# Patient Record
Sex: Female | Born: 1960
Health system: Southern US, Community
[De-identification: ages and names within clinical notes are randomized; demographics above are authoritative.]

## PROBLEM LIST (undated history)

## (undated) DIAGNOSIS — I10 Essential (primary) hypertension: Secondary | ICD-10-CM

## (undated) DIAGNOSIS — G44009 Cluster headache syndrome, unspecified, not intractable: Secondary | ICD-10-CM

## (undated) DIAGNOSIS — G43909 Migraine, unspecified, not intractable, without status migrainosus: Secondary | ICD-10-CM

## (undated) DIAGNOSIS — E785 Hyperlipidemia, unspecified: Secondary | ICD-10-CM

## (undated) DIAGNOSIS — R569 Unspecified convulsions: Secondary | ICD-10-CM

## (undated) HISTORY — DX: Essential (primary) hypertension: I10

## (undated) HISTORY — DX: Hyperlipidemia, unspecified: E78.5

## (undated) HISTORY — DX: Unspecified convulsions: R56.9

---

## 2008-09-08 ENCOUNTER — Emergency Department (HOSPITAL_COMMUNITY): Admission: EM | Admit: 2008-09-08 | Discharge: 2008-09-08 | Payer: Self-pay | Admitting: Emergency Medicine

## 2014-07-01 ENCOUNTER — Telehealth: Payer: Self-pay

## 2014-07-01 ENCOUNTER — Ambulatory Visit (INDEPENDENT_AMBULATORY_CARE_PROVIDER_SITE_OTHER): Payer: 59

## 2014-07-01 ENCOUNTER — Ambulatory Visit (INDEPENDENT_AMBULATORY_CARE_PROVIDER_SITE_OTHER): Payer: 59 | Admitting: Family Medicine

## 2014-07-01 VITALS — BP 146/78 | HR 84 | Temp 98.3°F | Resp 14 | Ht 68.0 in | Wt 212.0 lb

## 2014-07-01 DIAGNOSIS — R05 Cough: Secondary | ICD-10-CM

## 2014-07-01 DIAGNOSIS — J209 Acute bronchitis, unspecified: Secondary | ICD-10-CM

## 2014-07-01 DIAGNOSIS — R059 Cough, unspecified: Secondary | ICD-10-CM

## 2014-07-01 DIAGNOSIS — R062 Wheezing: Secondary | ICD-10-CM

## 2014-07-01 LAB — POCT CBC
GRANULOCYTE PERCENT: 70.5 % (ref 37–80)
HCT, POC: 46.6 % (ref 37.7–47.9)
Hemoglobin: 15.5 g/dL (ref 12.2–16.2)
LYMPH, POC: 1.5 (ref 0.6–3.4)
MCH: 29.2 pg (ref 27–31.2)
MCHC: 33.2 g/dL (ref 31.8–35.4)
MCV: 88.1 fL (ref 80–97)
MID (CBC): 0.5 (ref 0–0.9)
MPV: 6.8 fL (ref 0–99.8)
POC GRANULOCYTE: 4.9 (ref 2–6.9)
POC LYMPH PERCENT: 22.3 %L (ref 10–50)
POC MID %: 7.2 %M (ref 0–12)
Platelet Count, POC: 274 10*3/uL (ref 142–424)
RBC: 5.29 M/uL (ref 4.04–5.48)
RDW, POC: 13.7 %
WBC: 6.9 10*3/uL (ref 4.6–10.2)

## 2014-07-01 MED ORDER — AZITHROMYCIN 250 MG PO TABS
ORAL_TABLET | ORAL | Status: DC
Start: 2014-07-01 — End: 2014-07-03

## 2014-07-01 MED ORDER — ALBUTEROL SULFATE (2.5 MG/3ML) 0.083% IN NEBU
2.5000 mg | INHALATION_SOLUTION | Freq: Once | RESPIRATORY_TRACT | Status: AC
Start: 2014-07-01 — End: 2014-07-01
  Administered 2014-07-01: 2.5 mg via RESPIRATORY_TRACT

## 2014-07-01 MED ORDER — HYDROCODONE-HOMATROPINE 5-1.5 MG/5ML PO SYRP: 5.0000 mL | ORAL_SOLUTION | ORAL | Status: DC | PRN

## 2014-07-01 MED ORDER — ALBUTEROL SULFATE HFA 108 (90 BASE) MCG/ACT IN AERS: 2.0000 | INHALATION_SPRAY | Freq: Four times a day (QID) | RESPIRATORY_TRACT | Status: DC | PRN

## 2014-07-01 MED ORDER — PREDNISONE 20 MG PO TABS: ORAL_TABLET | ORAL | Status: DC

## 2014-07-01 NOTE — Patient Instructions (Signed)
Drink plenty of fluids and get enough rest  Take the prednisone 3 pills daily for 2 days, then 2 daily for 2 days, then 1 daily for 2 days, then one half daily for 4 days for opening up of the lungs from the inflammation  Take the azithromycin 2 pills initially, then 1 daily for 4 days for infection  Use the albuterol inhaler 2 inhalations every 4-6 hours as directed  Take the hydrocodone cough syrup (Hycodan) 1 teaspoon every 4-6 hours as needed for cough  Return at any time if acutely worse, and if not improved significantly over the next week or 10 days please return

## 2014-07-01 NOTE — Progress Notes (Addendum)
  Subjective:  Patient ID: Susan Hansen, female    DOB: October 31, 1960  Age: 54 y.o. MRN: [id number]  54 year old lady who is here for her first time. Over the past month or 6 weeks she's had a persistent cough. She had a cold-like infection initially. She had some low-grade fevers then. She has persisted intermittently with a cough. When she gets coughing hard sometimes has a hard time getting her breath. She wheezes some of those times. She is a nonsmoker. She has been bringing up some "cottage cheese" like stuff. She does not do any regular physical exercise. Sometimes she has coughing spells in the night that wake her up. She has tried various OTC medications including Mucinex and NyQuil. She is generally a healthy lady, has a lifetime history of a convulsive disorder for which she is on anticonvulsive therapy. She is fairly well controlled, but cannot drive. She works at [employer].   Objective:   Pleasant lady, alert and oriented. TMs normal. Throat clear. Nose not very congested today. Without significant nodes. Chest has coarse rhonchi in both lower lungs. On forced expiration she wheezes more. She gets into spasms of coughing with deep breathing.  Assessment & Plan:   Assessment: Bronchitis, with an asthmatic bronchitis component  Plan: Chest x-ray and CBC Try hand-held nebulizer treatment with albuterol to see if that opens her up better.  Peak flow 350 with predicted 435. Posttreatment the peak flow was still only 340 at best.  Will treat symptomatically and with antibiotics  [facility] reading (PRIMARY) by  Dr. Alwyn RenHopper No acute infiltrates  Results for orders placed or performed in visit on 07/01/14  POCT CBC  Result Value Ref Range   WBC 6.9 4.6 - 10.2 K/uL   Lymph, poc 1.5 0.6 - 3.4   POC LYMPH PERCENT 22.3 10 - 50 %L   MID (cbc) 0.5 0 - 0.9   POC MID % 7.2 0 - 12 %M   POC Granulocyte 4.9 2 - 6.9   Granulocyte percent 70.5 37 - 80 %G   RBC 5.29 4.04 - 5.48 M/uL   Hemoglobin 15.5  12.2 - 16.2 g/dL   HCT, POC 82.946.6 56.237.7 - 47.9 %   MCV 88.1 80 - 97 fL   MCH, POC 29.2 27 - 31.2 pg   MCHC 33.2 31.8 - 35.4 g/dL   RDW, POC 13.013.7 %   Platelet Count, POC 274 142 - 424 K/uL   MPV 6.8 0 - 99.8 fL   .    Patient Instructions  Drink plenty of fluids and get enough rest  Take the prednisone 3 pills daily for 2 days, then 2 daily for 2 days, then 1 daily for 2 days, then one half daily for 4 days for opening up of the lungs from the inflammation  Take the azithromycin 2 pills initially, then 1 daily for 4 days for infection  Use the albuterol inhaler 2 inhalations every 4-6 hours as directed  Take the hydrocodone cough syrup (Hycodan) 1 teaspoon every 4-6 hours as needed for cough  Return at any time if acutely worse, and if not improved significantly over the next week or 10 days please return     Lyon Dumont, MD 07/01/2014

## 2014-07-01 NOTE — Telephone Encounter (Signed)
Patient states she no longer uses [facility] and meant to have her prescriptions [facility] on [address]. [facility] but is requesting "Albuterol", "Azithromyin" and her "Prednisone" to be [facility] to [facility]. I advised patient to contact [facility] and see if they would transfer them for her. She stated she would try but still requesting message to be sent for her. Patients call back number for today is [phone]

## 2014-07-02 NOTE — Telephone Encounter (Signed)
Fine [facility] to [facility]

## 2014-07-02 NOTE — Telephone Encounter (Signed)
Ok to send [facility] to [facility]?

## 2014-07-03 MED ORDER — PREDNISONE 20 MG PO TABS: ORAL_TABLET | ORAL | Status: DC

## 2014-07-03 MED ORDER — ALBUTEROL SULFATE HFA 108 (90 BASE) MCG/ACT IN AERS: 2.0000 | INHALATION_SPRAY | Freq: Four times a day (QID) | RESPIRATORY_TRACT | Status: DC | PRN

## 2014-07-03 MED ORDER — AZITHROMYCIN 250 MG PO TABS: ORAL_TABLET | ORAL | Status: DC

## 2014-07-03 NOTE — Telephone Encounter (Signed)
Rx's sent. Called pt to let her know, Unable to leave VM.

## 2015-04-01 ENCOUNTER — Ambulatory Visit (INDEPENDENT_AMBULATORY_CARE_PROVIDER_SITE_OTHER): Payer: 59 | Admitting: Physician Assistant

## 2015-04-01 VITALS — BP 146/86 | HR 81 | Temp 98.2°F | Resp 18 | Ht 67.25 in | Wt 221.6 lb

## 2015-04-01 DIAGNOSIS — J209 Acute bronchitis, unspecified: Secondary | ICD-10-CM

## 2015-04-01 MED ORDER — HYDROCOD POLST-CPM POLST ER 10-8 MG/5ML PO SUER: 5.0000 mL | Freq: Two times a day (BID) | ORAL | Status: DC | PRN

## 2015-04-01 MED ORDER — AZITHROMYCIN 250 MG PO TABS: ORAL_TABLET | ORAL | Status: AC

## 2015-04-01 MED ORDER — BENZONATATE 100 MG PO CAPS: 100.0000 mg | ORAL_CAPSULE | Freq: Three times a day (TID) | ORAL | Status: DC | PRN

## 2015-04-01 NOTE — Patient Instructions (Signed)
Drink plenty of water (64 oz/day) and get plenty of rest. If you have been prescribed a cough syrup, do not drive or operate heavy machinery while using this medication. Take tessalon during the day. Take zpak antibiotic as prescribed. Stop other home meds, except cough drops. If your symptoms are not improving in 10 days, return to clinic.

## 2015-04-01 NOTE — Progress Notes (Signed)
[facility] [address], [place] [id number] [phone]  Date:  04/01/2015   Name:  Susan RicherKathy P Grob   DOB:  [birth year]   MRN:  [id number]  PCP:  No primary care provider on file.    Chief Complaint: Cough   History of Present Illness:  This is a 55 y.o. female who is presenting with a persistent cough x 2.5 weeks. Mostly dry cough, minimal mucus in past few days. Cough has stayed the same since onset. Now getting a little sore in her bilateral ribs. Did have some nasal congestion but that resolved. No fever, chills, sore throat. Cough seems worse at night, although does not disturb her sleep. Has had mild wheezing. No SOB. Taking mucinex, cough drops and water. Not helping much.  Not a smoker. No history of asthma or env allergies. Only occ problems with heartburn.  Review of Systems:  Review of Systems See HPI  There are no active problems to display for this patient.   Prior to Admission medications   Medication Sig Start Date End Date Taking? Authorizing Provider  felbamate (FELBATOL) 600 MG tablet Take 600 mg by mouth 4 (four) times daily.   Yes Historical Provider, MD    No Known Allergies  Past Surgical History  Procedure Laterality Date  . Cesarean section      Social History  Substance Use Topics  . Smoking status: Never Smoker   . Smokeless tobacco: None  . Alcohol Use: None    Family History  Problem Relation Age of Onset  . Diabetes Father   . Hypertension Father     Medication list has been reviewed and updated.  Physical Examination:  Physical Exam  Constitutional: She is oriented to person, place, and time. She appears well-developed and well-nourished. No distress.  HENT:  Head: Normocephalic and atraumatic.  Right Ear: Hearing, external ear and ear canal normal.  Left Ear: Hearing, tympanic membrane, external ear and ear canal normal.  Nose: Nose normal.  Mouth/Throat: Uvula is midline, oropharynx is clear and  moist and mucous membranes are normal.  Eyes: Conjunctivae and lids are normal. Right eye exhibits no discharge. Left eye exhibits no discharge. No scleral icterus.  Cardiovascular: Normal rate, regular rhythm, normal heart sounds and normal pulses.   No murmur heard. Pulmonary/Chest: Effort normal. No respiratory distress. She has no wheezes. She has rhonchi (few). She has no rales.  Musculoskeletal: Normal range of motion.  Lymphadenopathy:       Head (right side): No submental, no submandibular and no tonsillar adenopathy present.       Head (left side): No submental, no submandibular and no tonsillar adenopathy present.    She has no cervical adenopathy.  Neurological: She is alert and oriented to person, place, and time.  Skin: Skin is warm, dry and intact. No lesion and no rash noted.  Psychiatric: She has a normal mood and affect. Her speech is normal and behavior is normal. Thought content normal.   BP 146/86 mmHg  Pulse 81  Temp(Src) 98.2 F (36.8 C) (Oral)  Resp 18  Ht 5' 7.25" (1.708 m)  Wt 221 lb 9.6 oz (100.517 kg)  BMI 34.46 kg/m2  SpO2 94%  Assessment and Plan:  1. Acute bronchitis, unspecified organism Return in 7-10 days if symptoms do not improve or at any time if symptoms worsen.  - benzonatate (TESSALON) 100 MG capsule; Take 1-2 capsules (100-200 mg total) by mouth 3 (three) times daily as needed  for cough.  Dispense: 40 capsule; Refill: 0 - chlorpheniramine-HYDROcodone (TUSSIONEX PENNKINETIC ER) 10-8 MG/5ML SUER; Take 5 mLs by mouth every 12 (twelve) hours as needed for cough.  Dispense: 100 mL; Refill: 0 - azithromycin (ZITHROMAX) 250 MG tablet; Take 2 tabs PO x 1 dose, then 1 tab PO QD x 4 days  Dispense: 6 tablet; Refill: 0   [place] [employer]. Dyke Brackett, MHS [facility]  04/01/2015

## 2017-06-03 DIAGNOSIS — G44021 Chronic cluster headache, intractable: Secondary | ICD-10-CM | POA: Diagnosis not present

## 2017-06-03 DIAGNOSIS — G44019 Episodic cluster headache, not intractable: Secondary | ICD-10-CM | POA: Diagnosis not present

## 2017-12-14 ENCOUNTER — Emergency Department (HOSPITAL_BASED_OUTPATIENT_CLINIC_OR_DEPARTMENT_OTHER)
Admission: EM | Admit: 2017-12-14 | Discharge: 2017-12-14 | Disposition: A | Discharge status: Home / Self Care | Payer: BLUE CROSS/BLUE SHIELD | Attending: Emergency Medicine | Admitting: Emergency Medicine

## 2017-12-14 ENCOUNTER — Other Ambulatory Visit: Payer: Self-pay

## 2017-12-14 ENCOUNTER — Encounter (HOSPITAL_BASED_OUTPATIENT_CLINIC_OR_DEPARTMENT_OTHER): Payer: Self-pay

## 2017-12-14 DIAGNOSIS — R110 Nausea: Secondary | ICD-10-CM | POA: Diagnosis not present

## 2017-12-14 DIAGNOSIS — R638 Other symptoms and signs concerning food and fluid intake: Secondary | ICD-10-CM | POA: Insufficient documentation

## 2017-12-14 DIAGNOSIS — R1012 Left upper quadrant pain: Secondary | ICD-10-CM | POA: Insufficient documentation

## 2017-12-14 DIAGNOSIS — R1013 Epigastric pain: Secondary | ICD-10-CM | POA: Diagnosis not present

## 2017-12-14 MED ORDER — ALUM & MAG HYDROXIDE-SIMETH 200-200-20 MG/5ML PO SUSP
30.0000 mL | Freq: Once | ORAL | Status: AC
  Administered 2017-12-14: 30 mL via ORAL
  Filled 2017-12-14: qty 30

## 2017-12-14 MED ORDER — OMEPRAZOLE 20 MG PO CPDR: 20.0000 mg | DELAYED_RELEASE_CAPSULE | Freq: Every day | ORAL | 1 refills | Status: DC

## 2017-12-14 NOTE — ED Triage Notes (Signed)
Pt c/o LUQ pain x 6 months-worse x 2-3 days-nausea-denies v/d-NAD-steady gait

## 2017-12-14 NOTE — ED Notes (Signed)
ED Provider at bedside. 

## 2017-12-14 NOTE — ED Provider Notes (Signed)
[facility] EMERGENCY DEPARTMENT Provider Note   CSN: [id number] Arrival date & time: 12/14/17  1237     History   Chief Complaint Chief Complaint  Patient presents with  . Abdominal Pain    HPI Susan Hansen is a 57 y.o. female w PMHx seizure disorder, presenting to the ED with 6 months of intermittent LUQ/epigastric abdominal pressure. She states symptoms come and go, sometimes worse when laying flat. She does have hx of GERD and gastric ulcers. States the decreased appetite and nausea remind her of ulcers, however the pressure is different. She endorses increased stress at home with deaths in the family and her husbands health. She occasionally treats with an OTC antacid or pepto bismol. Denies CP, SOB, cough, vomiting, diarrhea, constipation.   The history is provided by the patient.    Past Medical History:  Diagnosis Date  . Seizures (HCC)     There are no active problems to display for this patient.   Past Surgical History:  Procedure Laterality Date  . CESAREAN SECTION       OB History   None      Home Medications    Prior to Admission medications   Medication Sig Start Date End Date Taking? Authorizing Provider  benzonatate (TESSALON) 100 MG capsule Take 1-2 capsules (100-200 mg total) by mouth 3 (three) times daily as needed for cough. 04/01/15   Dorna Leitz, PA-C  chlorpheniramine-HYDROcodone (TUSSIONEX PENNKINETIC ER) 10-8 MG/5ML SUER Take 5 mLs by mouth every 12 (twelve) hours as needed for cough. 04/01/15   Dorna Leitz, PA-C  felbamate (FELBATOL) 600 MG tablet Take 600 mg by mouth 4 (four) times daily.    [provider]  omeprazole (PRILOSEC) 20 MG capsule Take 1 capsule (20 mg total) by mouth daily. 12/14/17   Robinson, [place] N, PA-C    Family History Family History  Problem Relation Age of Onset  . Diabetes Father   . Hypertension Father     Social History Social History   Tobacco Use  . Smoking status: Never Smoker    . Smokeless tobacco: Never Used  Substance Use Topics  . Alcohol use: Yes    Comment: rare  . Drug use: Never     Allergies   Patient has no known allergies.   Review of Systems Review of Systems  Constitutional: Positive for appetite change. Negative for fever.  Respiratory: Negative for cough and shortness of breath.   Cardiovascular: Negative for chest pain.  Gastrointestinal: Positive for abdominal pain and nausea. Negative for constipation, diarrhea and vomiting.  All other systems reviewed and are negative.    Physical Exam Updated Vital Signs BP (!) 176/101 (BP Location: Right Arm)   Pulse 83   Temp 98.4 F (36.9 C) (Oral)   Resp 18   Ht 5\' 8"  (1.727 m)   Wt 89.5 kg   SpO2 98%   BMI 30.01 kg/m   Physical Exam  Constitutional: She appears well-developed and well-nourished. No distress.  HENT:  Head: Normocephalic and atraumatic.  Eyes: Conjunctivae are normal.  Cardiovascular: Normal rate, regular rhythm and normal heart sounds.  Pulmonary/Chest: Effort normal and breath sounds normal.  Abdominal: Soft. Bowel sounds are normal. She exhibits no distension and no mass. There is tenderness (mild) in the left upper quadrant. There is no rigidity, no rebound and no guarding.  Neurological: She is alert.  Skin: Skin is warm.  Psychiatric: She has a normal mood and affect. Her behavior is  normal.  Nursing note and vitals reviewed.    ED Treatments / Results  Labs (all labs ordered are listed, but only abnormal results are displayed) Labs Reviewed - No data to display  EKG None  Radiology No results found.  Procedures Procedures (including critical care time)  Medications Ordered in ED Medications  alum & mag hydroxide-simeth (MAALOX/MYLANTA) 200-200-20 MG/5ML suspension 30 mL (30 mLs Oral Given 12/14/17 1348)     Initial Impression / Assessment and Plan / ED Course  I have reviewed the triage vital signs and the nursing notes.  Pertinent labs  & imaging results that were available during my care of the patient were reviewed by me and considered in my medical decision making (see chart for details).    Patient presenting with left upper quadrant abdominal "pressure" x6 months.  She states symptoms come and go.  Has associated decreased appetite and intermittent nausea.  No fevers, diarrhea, constipation, vomiting.  Does have history of GERD and gastric ulcers.  Has had increased stress at home.  On exam, she is well-appearing and not in distress.  Abdomen is soft with mild tenderness to the left upper quadrant.  No guarding or rebound.  Had shared decision-making with patient: given chronicity of symptoms and reassuring exam, discussed lab work versus discharge with symptomatic management.  She agreeable to plan with discharge with symptomatic management.  Patient symptoms have been ongoing for 6 months and unchanging.  She has increased stress at home, this may be causing a stress-induced gastritis, with her history of GERD and ulcers.  Low suspicion for acute intra-abdominal pathology with benign abdomen, presentation with red flags.  Discussed symptomatic management, diet modifications.  Prescribed omeprazole.  Recommend primary care follow-up for follow-up and blood pressure check.  Provided neurology referral per patient's request stating her neurologist is retiring in December and she needs a new provider for her seizure disorder.  No complaints regarding seizure disorder today.  She is well-appearing, agreeable to plan, and safe for discharge at this time.  Discussed results, findings, treatment and follow up. Patient advised of return precautions. Patient verbalized understanding and agreed with plan.   Final Clinical Impressions(s) / ED Diagnoses   Final diagnoses:  Intermittent left upper quadrant abdominal pain    ED Discharge Orders         Ordered    omeprazole (PRILOSEC) 20 MG capsule  Daily     12/14/17 1414            Robinson, [place] N, PA-C 12/14/17 1634    Melene PlanFloyd, Dan, DO 12/14/17 1920

## 2017-12-14 NOTE — Discharge Instructions (Signed)
Begin taking the omeprazole once daily.  Avoid acidic, spicy, greasy foods.  Avoid NSAIDs such as ibuprofen, Advil, Motrin, Aleve, BC powder, Goody's powder, aspirin.  These medications can aggravate your stomach lining.  Limit your caffeine. You can take an over-the-counter Maalox as needed for abdominal discomfort as well. Establish primary care. Return to the emergency department if you develop fever, severe abdominal pain, uncontrollable vomiting, or new or concerning symptoms.

## 2017-12-14 NOTE — ED Notes (Addendum)
Pt states that she has had LUQ "pressure" for approx 6 months. She states the reason she came to ED today is because she is worried that it has not gone away. She states she has a loss of appetite for approx. 1 month.  She has not eaten today. Pt denies pain, nausea, vomiting and diarrhea. Pt denies feeling bloated. Pt has taken no medication for her symptoms. She states that she has been stressed r/t family issues.

## 2017-12-16 ENCOUNTER — Emergency Department (HOSPITAL_BASED_OUTPATIENT_CLINIC_OR_DEPARTMENT_OTHER): Payer: BLUE CROSS/BLUE SHIELD

## 2017-12-16 ENCOUNTER — Other Ambulatory Visit: Payer: Self-pay

## 2017-12-16 ENCOUNTER — Emergency Department (HOSPITAL_BASED_OUTPATIENT_CLINIC_OR_DEPARTMENT_OTHER)
Admission: EM | Admit: 2017-12-16 | Discharge: 2017-12-16 | Disposition: A | Discharge status: Home / Self Care | Payer: BLUE CROSS/BLUE SHIELD | Attending: Emergency Medicine | Admitting: Emergency Medicine

## 2017-12-16 ENCOUNTER — Encounter (HOSPITAL_BASED_OUTPATIENT_CLINIC_OR_DEPARTMENT_OTHER): Payer: Self-pay | Admitting: Emergency Medicine

## 2017-12-16 DIAGNOSIS — R079 Chest pain, unspecified: Secondary | ICD-10-CM | POA: Diagnosis not present

## 2017-12-16 DIAGNOSIS — R030 Elevated blood-pressure reading, without diagnosis of hypertension: Secondary | ICD-10-CM | POA: Diagnosis not present

## 2017-12-16 DIAGNOSIS — R0789 Other chest pain: Secondary | ICD-10-CM | POA: Insufficient documentation

## 2017-12-16 DIAGNOSIS — Z79899 Other long term (current) drug therapy: Secondary | ICD-10-CM | POA: Insufficient documentation

## 2017-12-16 DIAGNOSIS — F419 Anxiety disorder, unspecified: Secondary | ICD-10-CM | POA: Insufficient documentation

## 2017-12-16 LAB — CBC
HCT: 44.8 % (ref 36.0–46.0)
Hemoglobin: 14.3 g/dL (ref 12.0–15.0)
MCH: 29.2 pg (ref 26.0–34.0)
MCHC: 31.9 g/dL (ref 30.0–36.0)
MCV: 91.4 fL (ref 80.0–100.0)
NRBC: 0 % (ref 0.0–0.2)
PLATELETS: 165 10*3/uL (ref 150–400)
RBC: 4.9 MIL/uL (ref 3.87–5.11)
RDW: 13 % (ref 11.5–15.5)
WBC: 4.8 10*3/uL (ref 4.0–10.5)

## 2017-12-16 LAB — I-STAT CHEM 8, ED
BUN: 14 mg/dL (ref 6–20)
CHLORIDE: 102 mmol/L (ref 98–111)
Calcium, Ion: 1.17 mmol/L (ref 1.15–1.40)
Creatinine, Ser: 0.5 mg/dL (ref 0.44–1.00)
GLUCOSE: 111 mg/dL — AB (ref 70–99)
HCT: 43 % (ref 36.0–46.0)
Hemoglobin: 14.6 g/dL (ref 12.0–15.0)
Potassium: 4.1 mmol/L (ref 3.5–5.1)
SODIUM: 139 mmol/L (ref 135–145)
TCO2: 26 mmol/L (ref 22–32)

## 2017-12-16 LAB — TROPONIN I: Troponin I: <0.03 ng/mL (ref <0.03)

## 2017-12-16 LAB — D-DIMER, QUANTITATIVE (NOT AT ARMC): D DIMER QUANT: 0.29 ug{FEU}/mL (ref 0.00–0.50)

## 2017-12-16 MED ORDER — SUCRALFATE 1 GM/10ML PO SUSP: 1.0000 g | Freq: Four times a day (QID) | ORAL | 0 refills | Status: DC

## 2017-12-16 MED ORDER — ACETAMINOPHEN 500 MG PO TABS
1000.0000 mg | ORAL_TABLET | Freq: Once | ORAL | Status: AC
  Administered 2017-12-16: 1000 mg via ORAL
  Filled 2017-12-16: qty 2

## 2017-12-16 MED FILL — CARAFATE 1 GM/10 ML SUSP: 1 | 11 days supply | Qty: 420 | Fill #0

## 2017-12-16 NOTE — ED Notes (Signed)
ED Provider at bedside. 

## 2017-12-16 NOTE — Discharge Instructions (Addendum)
Your chest tightness does not appear to be related to cardiac disease.  I do feel this is more of a musculoskeletal source and could be related to a rotator cuff injury.  This will need further evaluation by a primary care physician.  Please work on establishing care.  Is also important that you avoid smoking marijuana.

## 2017-12-16 NOTE — ED Provider Notes (Signed)
[facility] EMERGENCY DEPARTMENT Provider Note   CSN: [id number] Arrival date & time: 12/16/17  0805     History   Chief Complaint Chief Complaint  Patient presents with  . Chest Pain    HPI Susan Hansen is a 57 y.o. female.  Patient is a 58 year old female presenting with left-sided chest pain.  PMH significant for seizure disorder.  Patient has no prior cardiac history or family history of early MI or heart disease.  Patient reports gradual onset of left-sided chest tightness over the last 6 months which has worsened over the last few days.  Pain is not worse with exertion not improved with rest.  Chest tightness radiates down left arm into left jaw.  Patient denies associated diaphoresis, shortness of breath, vomiting but does report some nausea unrelated to chest tightness episodes.  Patient does not take any medication.  She is followed by a neurologist for her seizure disorder but does not see a PCP.  Patient denies tobacco use but does report "remote use of marijuana" with her last hit yesterday which she says helps with her seizures.  Patient denies alcohol or other illicit drug use.     Past Medical History:  Diagnosis Date  . Seizures (HCC)     There are no active problems to display for this patient.   Past Surgical History:  Procedure Laterality Date  . CESAREAN SECTION       OB History   None      Home Medications    Prior to Admission medications   Medication Sig Start Date End Date Taking? Authorizing Provider  benzonatate (TESSALON) 100 MG capsule Take 1-2 capsules (100-200 mg total) by mouth 3 (three) times daily as needed for cough. 04/01/15   Dorna Leitz, PA-C  chlorpheniramine-HYDROcodone (TUSSIONEX PENNKINETIC ER) 10-8 MG/5ML SUER Take 5 mLs by mouth every 12 (twelve) hours as needed for cough. 04/01/15   Dorna Leitz, PA-C  felbamate (FELBATOL) 600 MG tablet Take 600 mg by mouth 4 (four) times daily.    [provider]    omeprazole (PRILOSEC) 20 MG capsule Take 1 capsule (20 mg total) by mouth daily. 12/14/17   Robinson, [place] N, PA-C  sucralfate (CARAFATE) 1 GM/10ML suspension Take 10 mLs (1 g total) by mouth 4 (four) times daily. 12/16/17 12/16/18  Wendee Beavers, DO    Family History Family History  Problem Relation Age of Onset  . Diabetes Father   . Hypertension Father     Social History Social History   Tobacco Use  . Smoking status: Never Smoker  . Smokeless tobacco: Never Used  Substance Use Topics  . Alcohol use: Yes    Comment: rare  . Drug use: Never     Allergies   Patient has no known allergies.   Review of Systems Review of Systems  Constitutional: Negative for chills, diaphoresis, fatigue and fever.  HENT: Negative for congestion and sore throat.   Eyes: Negative for pain and visual disturbance.  Respiratory: Negative for cough and shortness of breath.   Cardiovascular: Positive for chest pain and palpitations.  Gastrointestinal: Positive for nausea. Negative for abdominal pain and vomiting.  Genitourinary: Negative for dysuria and frequency.  Musculoskeletal: Negative for arthralgias and back pain.  Skin: Negative for color change and rash.  Neurological: Negative for dizziness, seizures, syncope and headaches.  Psychiatric/Behavioral: The patient is nervous/anxious.   All other systems reviewed and are negative.    Physical Exam Updated Vital Signs  BP 135/73   Pulse 75   Temp 98.3 F (36.8 C) (Oral)   Resp 15   SpO2 95%   Physical Exam  Constitutional: She appears well-developed and well-nourished.  Non-toxic appearance. She does not appear ill.  HENT:  Head: Normocephalic and atraumatic.  Eyes: Conjunctivae are normal.  Neck: Neck supple.  Cardiovascular: Normal rate and regular rhythm. Exam reveals no S3, no S4 and no friction rub.  No murmur heard. Pulmonary/Chest: Effort normal and breath sounds normal. No respiratory distress.  Abdominal:  Soft. There is no tenderness.  Musculoskeletal: She exhibits no edema.  Moderate left shoulder discomfort with gentle passive ROM.  Bilateral symmetrical minimal nonpitting edema lower extremities up to mid shin  Neurological: She is alert.  Skin: Skin is warm and dry. She is not diaphoretic. No pallor.  Psychiatric: Her mood appears anxious.  Tearful on exam  Nursing note and vitals reviewed.    ED Treatments / Results  Labs (all labs ordered are listed, but only abnormal results are displayed) Labs Reviewed  I-STAT CHEM 8, ED - Abnormal; Notable for the following components:      Result Value   Glucose, Bld 111 (*)    All other components within normal limits  CBC  TROPONIN I  D-DIMER, QUANTITATIVE (NOT AT [facility])    EKG EKG Interpretation  Date/Time:  Friday December 16 2017 08:12:47 EST Ventricular Rate:  91 PR Interval:    QRS Duration: 89 QT Interval:  324 QTC Calculation: 399 R Axis:   26 Text Interpretation:  Sinus rhythm Probable left atrial enlargement Baseline wander in lead(s) II III aVF No STEMI.  Confirmed by Alona BeneLong, Joshua [phone]([id number]) on 12/16/2017 8:23:46 AM   Radiology Dg Chest Port 1 View  Result Date: 12/16/2017 CLINICAL DATA:  Chest pain EXAM: PORTABLE CHEST 1 VIEW COMPARISON:  July 01, 2014 FINDINGS: No edema or consolidation. Heart size and pulmonary vascularity are normal. No adenopathy. No bone lesions. No pneumothorax. IMPRESSION: No edema or consolidation. Electronically Signed   By: Bretta BangWilliam  Woodruff III M.D.   On: 12/16/2017 08:41    Procedures Procedures (including critical care time)  Medications Ordered in ED Medications  acetaminophen (TYLENOL) tablet 1,000 mg (1,000 mg Oral Given 12/16/17 0932)     Initial Impression / Assessment and Plan / ED Course  I have reviewed the triage vital signs and the nursing notes.  Pertinent labs & imaging results that were available during my care of the patient were reviewed by me and considered in my  medical decision making (see chart for details).  Patient is a 57 year old female presenting with atypical chest pain with no prior history of cardiac history.  She is in clear discomfort regarding her left chest tightness and is anxious and tearful on exam.  Lungs are clear to auscultation and there is no evidence of DVT.  Left chest discomfort is exacerbated with gentle range of motion of the left shoulder.  Vital signs here for hypertension at 160s/80s, otherwise stable.  EKG NSR at 91 bpm, no ST changes or T wave abnormalities, QTC 399, no blocks. CXR unremarkable.  Troponin and d-dimer negative.  CBC and i-STAT Chem-8 unremarkable.  Low suspicion for ACS or PE.  Suspect chronic chest discomfort is related to musculoskeletal source.  Could be rotator cuff related given vague localized pain and exacerbation with movement in all directions of left arm.  Reviewed return precautions.  Discussed conservative management.  Instructed patient to establish with PCP for further work-up of  chronic left arm pain.  Patient requesting additional therapy for reflux while on PPI.  Given prescription for Carafate.  Also given contact information for heart and vascular.  Final Clinical Impressions(s) / ED Diagnoses   Final diagnoses:  Atypical chest pain    ED Discharge Orders         Ordered    sucralfate (CARAFATE) 1 GM/10ML suspension  4 times daily     12/16/17 1002           Wendee Beavers, DO 12/16/17 1002    Maia Plan, MD 12/17/17 [phone]

## 2017-12-16 NOTE — ED Triage Notes (Signed)
Pt states heaviness in left chest for 6 months, but has been more acute for the last few days. States radiation into jaw and SOB accompanies this pain.

## 2017-12-20 DIAGNOSIS — R1012 Left upper quadrant pain: Secondary | ICD-10-CM | POA: Diagnosis not present

## 2017-12-27 ENCOUNTER — Other Ambulatory Visit: Payer: Self-pay | Admitting: Gastroenterology

## 2017-12-27 DIAGNOSIS — R1012 Left upper quadrant pain: Secondary | ICD-10-CM | POA: Diagnosis not present

## 2017-12-30 ENCOUNTER — Ambulatory Visit
Admission: RE | Admit: 2017-12-30 | Discharge: 2017-12-30 | Disposition: A | Discharge status: Home / Self Care | Payer: BLUE CROSS/BLUE SHIELD | Source: Ambulatory Visit | Attending: Gastroenterology | Admitting: Gastroenterology

## 2017-12-30 DIAGNOSIS — R1012 Left upper quadrant pain: Secondary | ICD-10-CM | POA: Diagnosis not present

## 2018-01-12 DIAGNOSIS — K635 Polyp of colon: Secondary | ICD-10-CM | POA: Diagnosis not present

## 2018-01-12 DIAGNOSIS — K2930 Chronic superficial gastritis without bleeding: Secondary | ICD-10-CM | POA: Diagnosis not present

## 2018-01-12 DIAGNOSIS — K5730 Diverticulosis of large intestine without perforation or abscess without bleeding: Secondary | ICD-10-CM | POA: Diagnosis not present

## 2018-01-12 DIAGNOSIS — K228 Other specified diseases of esophagus: Secondary | ICD-10-CM | POA: Diagnosis not present

## 2018-01-12 DIAGNOSIS — D124 Benign neoplasm of descending colon: Secondary | ICD-10-CM | POA: Diagnosis not present

## 2018-01-12 DIAGNOSIS — K449 Diaphragmatic hernia without obstruction or gangrene: Secondary | ICD-10-CM | POA: Diagnosis not present

## 2018-01-12 DIAGNOSIS — K3189 Other diseases of stomach and duodenum: Secondary | ICD-10-CM | POA: Diagnosis not present

## 2018-01-12 DIAGNOSIS — K219 Gastro-esophageal reflux disease without esophagitis: Secondary | ICD-10-CM | POA: Diagnosis not present

## 2018-01-12 DIAGNOSIS — R1012 Left upper quadrant pain: Secondary | ICD-10-CM | POA: Diagnosis not present

## 2018-01-17 DIAGNOSIS — D124 Benign neoplasm of descending colon: Secondary | ICD-10-CM | POA: Diagnosis not present

## 2018-01-17 DIAGNOSIS — K635 Polyp of colon: Secondary | ICD-10-CM | POA: Diagnosis not present

## 2018-01-17 DIAGNOSIS — K219 Gastro-esophageal reflux disease without esophagitis: Secondary | ICD-10-CM | POA: Diagnosis not present

## 2018-01-17 DIAGNOSIS — K2930 Chronic superficial gastritis without bleeding: Secondary | ICD-10-CM | POA: Diagnosis not present

## 2018-01-25 HISTORY — PX: BREAST BIOPSY: SHX20

## 2018-03-17 DIAGNOSIS — G40909 Epilepsy, unspecified, not intractable, without status epilepticus: Secondary | ICD-10-CM | POA: Diagnosis not present

## 2018-03-17 DIAGNOSIS — R030 Elevated blood-pressure reading, without diagnosis of hypertension: Secondary | ICD-10-CM | POA: Diagnosis not present

## 2018-05-11 ENCOUNTER — Ambulatory Visit: Payer: BLUE CROSS/BLUE SHIELD | Admitting: Neurology

## 2018-06-22 ENCOUNTER — Other Ambulatory Visit: Payer: Self-pay | Admitting: Family Medicine

## 2018-06-22 ENCOUNTER — Other Ambulatory Visit (HOSPITAL_COMMUNITY)
Admission: RE | Admit: 2018-06-22 | Discharge: 2018-06-22 | Disposition: A | Discharge status: Home / Self Care | Payer: BLUE CROSS/BLUE SHIELD | Source: Ambulatory Visit | Attending: Family Medicine | Admitting: Family Medicine

## 2018-06-22 DIAGNOSIS — R030 Elevated blood-pressure reading, without diagnosis of hypertension: Secondary | ICD-10-CM | POA: Diagnosis not present

## 2018-06-22 DIAGNOSIS — Z124 Encounter for screening for malignant neoplasm of cervix: Secondary | ICD-10-CM | POA: Diagnosis not present

## 2018-06-22 DIAGNOSIS — Z1211 Encounter for screening for malignant neoplasm of colon: Secondary | ICD-10-CM | POA: Diagnosis not present

## 2018-06-22 DIAGNOSIS — Z23 Encounter for immunization: Secondary | ICD-10-CM | POA: Diagnosis not present

## 2018-06-22 DIAGNOSIS — Z1159 Encounter for screening for other viral diseases: Secondary | ICD-10-CM | POA: Diagnosis not present

## 2018-06-22 DIAGNOSIS — Z13220 Encounter for screening for lipoid disorders: Secondary | ICD-10-CM | POA: Diagnosis not present

## 2018-06-22 DIAGNOSIS — Z0000 Encounter for general adult medical examination without abnormal findings: Secondary | ICD-10-CM | POA: Diagnosis not present

## 2018-06-22 DIAGNOSIS — G40909 Epilepsy, unspecified, not intractable, without status epilepticus: Secondary | ICD-10-CM | POA: Diagnosis not present

## 2018-06-27 LAB — CYTOLOGY - PAP
Diagnosis: NEGATIVE
HPV: NOT DETECTED

## 2018-07-10 ENCOUNTER — Telehealth: Payer: Self-pay | Admitting: *Deleted

## 2018-07-10 NOTE — Telephone Encounter (Signed)
I called the pt re: 6/18 8:00 AM appt. I offered an in-office visit if pt comfortable. I advised a call back would be needed by 6/17 Wed or appt would be canceled and pt will need to cb to r/s for next available. I reviewed the [place] screening questions as well and advised mask is required for visit and to have pt bring her own. Left office number for cb.

## 2018-07-11 NOTE — Telephone Encounter (Addendum)
Pt returned call and has accepted an In Office visit. Pt states that she has not left the state in the past 14 days , has not been exposed to anyone with the virus and is not having any symptoms. Pt is aware of having to bring her own mask.

## 2018-07-13 ENCOUNTER — Encounter: Payer: Self-pay | Admitting: Neurology

## 2018-07-13 ENCOUNTER — Other Ambulatory Visit: Payer: Self-pay

## 2018-07-13 ENCOUNTER — Ambulatory Visit: Payer: BLUE CROSS/BLUE SHIELD | Admitting: Neurology

## 2018-07-13 ENCOUNTER — Ambulatory Visit (INDEPENDENT_AMBULATORY_CARE_PROVIDER_SITE_OTHER): Payer: BLUE CROSS/BLUE SHIELD | Admitting: Neurology

## 2018-07-13 VITALS — BP 132/75 | HR 76 | Temp 98.0°F | Ht 66.0 in | Wt 185.0 lb

## 2018-07-13 DIAGNOSIS — G44019 Episodic cluster headache, not intractable: Secondary | ICD-10-CM

## 2018-07-13 DIAGNOSIS — G40219 Localization-related (focal) (partial) symptomatic epilepsy and epileptic syndromes with complex partial seizures, intractable, without status epilepticus: Secondary | ICD-10-CM

## 2018-07-13 MED ORDER — LORAZEPAM 1 MG PO TABS: ORAL_TABLET | ORAL | 4 refills | Status: DC

## 2018-07-13 NOTE — Progress Notes (Signed)
[facility]    Provider:  Dr Jaynee Hansen Requesting Provider: Marda Stalker, PA-C Primary Care Provider:  Marda Stalker, PA-C  CC:  Susan Hansen  HPI:  Susan Hansen is a 58 y.o. female here as requested by Susan Stalker, PA-C for partial seizures with generalization and cluster headaches on felbamate and topamax. PMHx HTm as well. Per neurology notes, she was on felbamate 600mg  3.5 tabs 4x daily and topamax 25mg  at last appt with neuro 07/07/17. Dxed at age 87. She does not drive. Last generalized seizure over a year ago. Typically one every 12-15 months. She was seeing Susan Hansen for many years and now he is retiring. Her father passed away in 07-08-18 and her best friend 09/2010. First seizure at 75, she has cluster headaches. She is not on the topamax bc the clusters are in remission. Last generalized seizure a year ago. She feels shaky sometimes and thinks it is glucose and eating protein(eggs) in the morning helps.. She has a seizure aura, she starts feeling something in her stomach, she can go to a room at that time, she starts shaking and she "zones out" and pictures [place] and drains her she feels extremely tired, everything goes into slow motion and then sometimes go away and then generalizes.  She is under stress. She started on neurontin, depakote, dilantin, tegretol,  valium and failed multiple meds until the Felbamate which works for her. Last aura once in the last 8-9 months and did not progress. Last GTC 1.5 years ago. Unclear why she had a breakthrough, she doesn't know how long they last or what happens, when it resolves she feels emotional and guilty. She does not drive. Unclear etiology of seizures but when she was 13 months she fell off a table. She has had extensive testing in the past and eegs and imaging.   Reviewed notes, labs and imaging from outside physicians, which showed:  CT head 09/08/2008 showed No acute intracranial abnormalities  including mass lesion or mass effect, hydrocephalus, extra-axial fluid collection, midline shift, hemorrhage, or acute infarction, large ischemic events (personally reviewed images)  I reviewed neurology notes from Susan Hansen.  The first note I see is in April 2017.  She has epilepsy in addition to headaches.  Patient at that time had complained of headache and went to ER and had a CT scan.  In addition to longstanding partial epilepsy she has difficulty with recurring headaches, they occur at night, unilateral occurring in the right orbital area starts as a small pain rapidly builds up to an intense almost intolerable steady headache.  No nausea or vomiting or photophobia.  The episodes last 30 minutes.  She does have some rhinorrhea on the same side recently seen in the emergency room where CAT scan was normal at that time.  Butalbital did not help.  She is had several episodes.  Diagnosed with cluster headaches.  No significant change in her seizure pattern with an occasional partial simple seizure.   Last time she was seen was in 07-07-17.  She was seen for seizure follow-up.  At that time patient was well controlled but seem to think she had rare episodes that might be an aura.  She is taking both Felbatol and Topamax on a regular basis with no particular problem.  Diagnosed with partial seizure with generalization.  Seizures well controlled.  She has been on this medication for years without progression to a generalized seizure.  Since she had been on the medication  for years no blood levels.  She is seen mostly every year.  Review of Systems: Patient complains of symptoms per HPI as well as the following symptoms: seizures. Pertinent negatives and positives per HPI. All others negative.   Social History   Socioeconomic History  . Marital status: Married    Spouse name: Not on file  . Number of children: 2  . Years of education: Not on file  . Highest education level: High school graduate   Occupational History  . Not on file  Social Needs  . Financial resource strain: Not on file  . Food insecurity    Worry: Not on file    Inability: Not on file  . Transportation needs    Medical: Not on file    Non-medical: Not on file  Tobacco Use  . Smoking status: Never Smoker  . Smokeless tobacco: Never Used  Substance and Sexual Activity  . Alcohol use: Not Currently    Comment: rare  . Drug use: Never  . Sexual activity: Not on file  Lifestyle  . Physical activity    Days per week: Not on file    Minutes per session: Not on file  . Stress: Not on file  Relationships  . Social Musicianconnections    Talks on phone: Not on file    Gets together: Not on file    Attends religious service: Not on file    Active member of club or organization: Not on file    Attends meetings of clubs or organizations: Not on file    Relationship status: Not on file  . Intimate partner violence    Fear of current or ex partner: Not on file    Emotionally abused: Not on file    Physically abused: Not on file    Forced sexual activity: Not on file  Other Topics Concern  . Not on file  Social History Narrative   Lives at home with her husband   Right handed    Family History  Problem Relation Age of Onset  . Diabetes Father   . Hypertension Father   . Seizures Neg Hx     Past Medical History:  Diagnosis Date  . Seizures [facility](HCC)     Patient Active Problem List   Diagnosis Date Noted  . Partial epilepsy with impairment of consciousness, with intractable epilepsy (HCC) 07/14/2018  . Episodic cluster headache 07/14/2018    Past Surgical History:  Procedure Laterality Date  . CESAREAN SECTION      Current Outpatient Medications  Medication Sig Dispense Refill  . Ascorbic Acid (VITAMIN C PO) Take by mouth.    Marland Kitchen. b complex vitamins tablet Take 1 tablet by mouth daily.    . Chlorpheniramine Maleate (ALLERGY RELIEF PO) Take 1 tablet by mouth daily. Allertec from [employer]    . Coenzyme Q10  (COQ10 PO) Take by mouth.    . felbamate (FELBATOL) 600 MG tablet Takes 3 tablets in the morning (5 AM), 1 tablet at lunchtime, and 3 tablets before bed.    Marland Kitchen. LECITHIN PO Take by mouth.    Marland Kitchen. lisinopril-hydrochlorothiazide (ZESTORETIC) 20-25 MG tablet Take 1 tablet by mouth daily.    Marland Kitchen. MAGNESIUM PO Take by mouth. At least 400 mg daily    . S-Adenosylmethionine (SAM-E PO) Take by mouth.    Marland Kitchen. LORazepam (ATIVAN) 1 MG tablet Please take 1 tablet at onset of aura. May take an additional dose in 5-15 minutes. Max 2mg  a day. 30 tablet 4  No current facility-administered medications for this visit.     Allergies as of 07/13/2018  . (No Known Allergies)    Vitals: BP 132/75 (BP Location: Right Arm, Patient Position: Sitting)   Pulse 76   Temp 98 F (36.7 C) Comment: taken by front staff  Ht 5\' 6"  (1.676 m)   Wt 185 lb (83.9 kg)   BMI 29.86 kg/m  Last Weight:  Wt Readings from Last 1 Encounters:  07/13/18 185 lb (83.9 kg)   Last Height:   Ht Readings from Last 1 Encounters:  07/13/18 5\' 6"  (1.676 m)     Physical exam: Exam: Gen: NAD, conversant, well nourised, obese, well groomed                     CV: RRR, no MRG. No Carotid Bruits. No peripheral edema, warm, nontender Eyes: Conjunctivae clear without exudates or hemorrhage  Neuro: Detailed Neurologic Exam  Speech:    Speech is normal; fluent and spontaneous with normal comprehension.  Cognition:    The patient is oriented to person, place, and time;     recent and remote memory intact;     language fluent;     normal attention, concentration,     fund of knowledge Cranial Nerves:    The pupils are equal, round, and reactive to light. The fundi are normal and spontaneous venous pulsations are present. Visual fields are full to finger confrontation. Extraocular movements are intact. Trigeminal sensation is intact and the muscles of mastication are normal. The face is symmetric. The palate elevates in the midline. Hearing  intact. Voice is normal. Shoulder shrug is normal. The tongue has normal motion without fasciculations.   Coordination:    Normal finger to nose and heel to shin. Normal rapid alternating movements.   Gait:    Heel-toe and tandem gait are normal.   Motor Observation:    No asymmetry, no atrophy, and no involuntary movements noted. Tone:    Normal muscle tone.    Posture:    Posture is normal. normal erect    Strength:    Strength is V/V in the upper and lower limbs.      Sensation: intact to LT     Reflex Exam:  DTR's:    Deep tendon reflexes in the upper and lower extremities are normal bilaterally.   Toes:    The toes are downgoing bilaterally.   Clonus:    Clonus is absent.    Assessment/Plan:  58 y.o. female here as requested by Jarrett SohoWharton, Courtney, PA-C for partial seizures with generalization and cluster headaches on felbamate and topamax. PMHx HTm as well. Per neurology notes, she was on felbamate 600mg  3.5 tabs 4x daily and topamax 25mg  at last appt with neuro 05-2017. Dxed at age 58. She does not drive.  Episodic Cluster HA: Used Topiramate and did well. Not on it now as the cluster headaches are in remission. If they occur, start high dose steroids 9start at 100mg  daily and taper over 2 weeks), Topamax or Emgality. She will call.   Partial seizures with generaliztion: Continue Felbamate. Ativan for aura. Per prior neuro notes, she has been prescribed Felbamate 600mg  3.5 tabs 4x a day(#240). This is an excessive does but considering she has been stable on this dose for years will continue. Discussed risks, she understands this is a very high dose and acknowledges risks. I called [facility] pharmacy on [address] to confirm and the Felbamate was not filled there. I will email  patient and ask her where she fills the Felbamate and call and verify dose and pills dispenced before prescribing.   Meds ordered this encounter  Medications  . LORazepam (ATIVAN) 1 MG tablet    Sig:  Please take 1 tablet at onset of aura. May take an additional dose in 5-15 minutes. Max 2mg  a day.    Dispense:  30 tablet    Refill:  4   Per [facility] [place] statutes, patients with seizures are not allowed to drive until they have been seizure-free for six months.    Use caution when using heavy equipment or power tools. Avoid working on ladders or at heights. Take showers instead of baths. Ensure the water temperature is not too high on the home water heater. Do not go swimming alone. Do not lock yourself in a room alone (i.e. bathroom). When caring for infants or small children, sit down when holding, feeding, or changing them to minimize risk of injury to the child in the event you have a seizure. Maintain good sleep hygiene. Avoid alcohol.    If patient has another seizure, call 911 and bring them back to the ED if: A.  The seizure lasts longer than 5 minutes.      B.  The patient doesn't wake shortly after the seizure or has new problems such as difficulty seeing, speaking or moving following the seizure C.  The patient was injured during the seizure D.  The patient has a temperature over 102 F (39C) E.  The patient vomited during the seizure and now is having trouble breathing   Cc: Jarrett SohoWharton, Courtney, PA-C,    Susan Hansen , MD  [facility] [address] [place], [place] [id number]  Phone [phone] Fax [phone]

## 2018-07-13 NOTE — Patient Instructions (Addendum)
Continue current medications Ativan(Lorazepam) for aura    Lorazepam: Patient drug information  Access Lex[employer] Online here.  Copyright [id number] [employer] All rights reserved.  (For additional information see "Lorazepam: Drug information" and see "Lorazepam: Pediatric drug information") Brand Names: US  Ativan;  LORazepam Intensol Brand Names: [place]  APO-LORazepam;  Ativan;  DOM-LORazepam [DSC];  PMS-LORazepam;  PRO-LORazepam;  TEVA-LORazepam Warning  . This drug is a benzodiazepine. The use of a benzodiazepine drug along with opioid drugs has led to very bad side effects. Side effects that have happened include slowed or trouble breathing and death. Opioid drugs include drugs like codeine, oxycodone, and morphine. Opioid drugs are used to treat pain and some are used to treat cough. Talk with the doctor.  . If you are taking this drug with an opioid drug, get medical help right away if you feel very sleepy or dizzy; if you have slow, shallow, or trouble breathing; or if you pass out. Caregivers or others need to get medical help right away if the patient does not respond, does not answer or react like normal, or will not wake up. What is this drug used for?  . It is used to treat anxiety.  . It is used to treat seizures.  . It is used to ease anxiety before surgery.  . It may be given to you for other reasons. Talk with the doctor. What do I need to tell my doctor BEFORE I take this drug?  . If you are allergic to this drug; any part of this drug; or any other drugs, foods, or substances. Tell your doctor about the allergy and what signs you had.  . If you have any of these health problems: Glaucoma, low mood (depression), or certain mental problems.  . If you have sleep apnea.  This is not a list of all drugs or health problems that interact with this drug.  Tell your doctor and pharmacist about all of your drugs (prescription or OTC, natural products, vitamins) and health  problems. You must check to make sure that it is safe for you to take this drug with all of your drugs and health problems. Do not start, stop, or change the dose of any drug without checking with your doctor. What are some things I need to know or do while I take this drug?  All products:  Marland Kitchen. Tell all of your health care providers that you take this drug. This includes your doctors, nurses, pharmacists, and dentists.  . Talk with your doctor before you use other drugs and natural products that slow your actions.  . Have your blood work checked if you are on this drug for a long time. Talk with your doctor.  . This drug may be habit-forming with long-term use.  . Do not take this drug for longer than you were told by your doctor.  . If you have been taking this drug on a regular basis and you stop it all of a sudden, you may have signs of withdrawal. Do not stop taking this drug all of a sudden without calling your doctor. Tell your doctor if you have any bad effects.  . If you are 60 or older, use this drug with care. You could have more side effects.  . Use with care in children. Talk with the doctor.  . This drug may cause harm to the unborn baby if you take it while you are pregnant. If you are pregnant or you get pregnant  while taking this drug, call your doctor right away.  . Tell your doctor if you are breast-feeding or plan to breast-feed. This drug passes into breast milk and may harm your baby.  All oral products:  . Avoid driving and doing other tasks or actions that call for you to be alert until you see how this drug affects you.  Marland Kitchen. Avoid drinking alcohol while taking this drug.  Injection:  . Avoid driving and doing other tasks or actions that call for alertness for 1 to 2 full days after getting this drug and until the effects of this drug have worn off.  . Do not try to get out of bed without help for at least 8 hours after you use this drug. You may fall and hurt yourself.  . Avoid  drinking alcohol for 1 to 2 full days after getting this drug.  . Some products have benzyl alcohol. Do not give a product that has benzyl alcohol in it to a newborn or infant. Talk with the doctor to see if this product has benzyl alcohol in it.  For a procedure:  . Studies in young animals and children have shown that frequent or long-term use of anesthesia drugs or drugs used for sleep in children younger than 683 years of age may lead to long-term brain problems. This may also happen in unborn babies if the mother uses this drug during the third trimester of pregnancy. Talk with the doctor. What are some side effects that I need to call my doctor about right away?  WARNING/CAUTION: Even though it may be rare, some people may have very bad and sometimes deadly side effects when taking a drug. Tell your doctor or get medical help right away if you have any of the following signs or symptoms that may be related to a very bad side effect:  . Signs of an allergic reaction, like rash; hives; itching; red, swollen, blistered, or peeling skin with or without fever; wheezing; tightness in the chest or throat; trouble breathing, swallowing, or talking; unusual hoarseness; or swelling of the mouth, face, lips, tongue, or throat.  . New or worse behavior or mood changes like depression or thoughts of suicide.  . Hallucinations (seeing or hearing things that are not there).  . Change in balance.  . Feeling confused.  . Memory problems or loss.  . Very bad dizziness or passing out.  . Change in eyesight.  . Muscle weakness.  Dot Been. Dark urine or yellow skin or eyes.  . This drug may cause very bad and sometimes deadly breathing problems. Call your doctor right away if you have slow, shallow, or trouble breathing. What are some other side effects of this drug?  All drugs may cause side effects. However, many people have no side effects or only have minor side effects. Call your doctor or get medical help if any of  these side effects or any other side effects bother you or do not go away:  All products:  Marland Kitchen. Feeling dizzy, sleepy, tired, or weak.  Marland Kitchen. Headache.  Injection:  . Irritation where the shot is given.  These are not all of the side effects that may occur. If you have questions about side effects, call your doctor. Call your doctor for medical advice about side effects.  You may report side effects to your national health agency. How is this drug best taken?  Use this drug as ordered by your doctor. Read all information given to you. Follow  all instructions closely.  All oral products:  Marland Kitchen. Take with or without food. Take with food if it causes an upset stomach.  Liquid (solution):  Marland Kitchen. Use the dropper that comes with this drug to measure the drug.  . Mix the liquid with water, juice, soda, applesauce, or pudding before taking it.  Tenna Child. Swallow the mixture right away. Do not store for use at a later time.  Under the tongue (sublingual) tablet:  . Place tablet under the tongue and let dissolve.  . Do not swallow for at least 2 minutes after using this drug.  Injection:  . It is given as a shot into a muscle or vein. What do I do if I miss a dose?  All oral products:  . If you take this drug on a regular basis, take a missed dose as soon as you think about it.  . If it is close to the time for your next dose, skip the missed dose and go back to your normal time.  . Do not take 2 doses at the same time or extra doses.  . Many times this drug is taken on an as needed basis. Do not take more often than told by the doctor.  Injection:  . Call your doctor to find out what to do. How do I store and/or throw out this drug?  Tablets and under the tongue (sublingual) tablets:  . Store at room temperature in a dry place. Do not store in a bathroom.  Liquid (solution):  . Store in a refrigerator. Do not freeze.  Marland Kitchen. Throw away any part not used after 3 months.  All oral products:  . Protect from light.   Injection:  . If you need to store this drug at home, talk with your doctor, nurse, or pharmacist about how to store it.  All products:  . Store this drug in a safe place where children cannot see or reach it, and where other people cannot get to it. A locked box or area may help keep this drug safe. Keep all drugs away from pets.  . Throw away unused or expired drugs. Do not flush down a toilet or pour down a drain unless you are told to do so. Check with your pharmacist if you have questions about the best way to throw out drugs. There may be drug take-back programs in your area. General drug facts  . If your symptoms or health problems do not get better or if they become worse, call your doctor.  . Do not share your drugs with others and do not take anyone else's drugs.  . Some drugs may have another patient information leaflet. If you have any questions about this drug, please talk with your doctor, nurse, pharmacist, or other health care provider.  . If you think there has been an overdose, call your poison control center or get medical care right away. Be ready to tell or show what was taken, how much, and when it happened.   Cluster Headache A cluster headache is a type of headache that causes deep, intense head pain. Cluster headaches can last from 15 minutes to 3 hours. They usually occur:  On one side of the head. They may occur on the other side when a new cluster of headaches begins.  Repeatedly over weeks to months.  Several times a day.  At the same time of day, often at night.  More often in the fall and springtime. What are the  causes? The cause of this condition is not known. What increases the risk? This condition is more likely to develop in:  Males.  People who drink alcohol.  People who smoke or use products that contain nicotine or tobacco.  People who take medicines that cause blood vessels to expand, such as nitroglycerin.  People who take antihistamines.  What are the signs or symptoms? Symptoms of this condition include:  Severe pain on one side of the head that begins behind or around your eye or temple.  Pain on one side of the head.  Nausea.  Sensitivity to light.  Runny nose and nasal stuffiness.  Sweaty, pale skin on the face.  Droopy or swollen eyelid, eye redness, or tearing.  Restlessness and agitation. How is this diagnosed? This condition may be diagnosed based on:  Your symptoms.  A physical exam. Your health care provider may order tests to see if your headaches are caused by another medical condition. These tests may show that you do not have cluster headaches. Tests may include:  A CT scan of your head.  An MRI of your head.  Lab tests. How is this treated? This condition may be treated with:  Medicines to relieve pain and to prevent repeated (recurrent) attacks. Some people may need a combination of medicines.  Oxygen. This helps to relieve pain. Follow these instructions at home: Headache diary Keep a headache diary as told by your health care provider. Doing this can help you and your health care provider figure out what triggers your headaches. In your headache diary, include information about:  The time of day that your headache started and what you were doing when it began.  How long your headache lasted.  Where your pain started and whether it moved to other areas.  The type of pain, such as burning, stabbing, throbbing, or cramping.  Your level of pain. Use a pain scale and rate the pain with a number from 1 (mild) up to 10 (severe).  The treatment that you used, and any change in symptoms after treatment.  Medicines  Take over-the-counter and prescription medicines only as told by your health care provider.  Do not drive or use heavy machinery while taking prescription pain medicine.  Use oxygen as told by your health care provider. Lifestyle  Follow a regular sleep schedule. Do  not vary the time that you go to bed or the amount that you sleep from day to day. It is important to stay on the same schedule during a cluster period to help prevent headaches.  Exercise regularly.  Eat a healthy diet and avoid foods that may trigger your headaches.  Avoid alcohol.  Do not use any products that contain nicotine or tobacco, such as cigarettes and e-cigarettes. If you need help quitting, ask your health care provider. Contact a health care provider if:  Your headaches change, become more severe, or occur more often.  The medicine or oxygen that your health care provider recommended does not help. Get help right away if:  You faint.  You have weakness or numbness, especially on one side of your body or face.  You have double vision.  You have nausea or vomiting that does not go away within several hours.  You have trouble talking, walking, or keeping your balance.  You have pain or stiffness in your neck.  You have a fever. Summary  A cluster headache is a type of headache that causes deep, intense head pain, usually on one  side of the head.  Keep a headache diary to help discover what triggers your headaches.  A regular sleep schedule can help prevent headaches. This information is not intended to replace advice given to you by your health care provider. Make sure you discuss any questions you have with your health care provider. Document Released: 01/11/2005 Document Revised: 09/23/2015 Document Reviewed: 09/23/2015 Elsevier Interactive Patient Education  2019 [employer].  Epilepsy Epilepsy is a condition in which a person has repeated seizures over time. A seizure is a sudden burst of abnormal electrical and chemical activity in the brain. Seizures can cause a change in attention, behavior, or the ability to remain awake and alert (altered mental status). Epilepsy increases a person's risk of falls, accidents, and injury. It can also lead to  complications, including:  Depression.  Poor memory.  Sudden unexplained death in epilepsy (SUDEP). This complication is rare, and its cause is not known. Most people with epilepsy lead normal lives. What are the causes? This condition may be caused by:  A head injury.  An injury that happens at birth.  A high fever during childhood.  A stroke.  Bleeding that goes into or around the brain.  Certain medicines and drugs.  Having too little oxygen for a long period of time.  Abnormal brain development.  Certain infections, such as meningitis and encephalitis.  Brain tumors.  Conditions that are passed along from parent to child (are hereditary). What are the signs or symptoms? Symptoms of a seizure vary greatly from person to person. They include:  Convulsions.  Stiffening of the body.  Involuntary movements of the arms or legs.  Loss of consciousness.  Breathing problems.  Falling suddenly.  Confusion.  Head nodding.  Eye blinking or fluttering.  Lip smacking.  Drooling.  Rapid eye movements.  Grunting.  Loss of bladder control and bowel control.  Staring.  Unresponsiveness. Some people have symptoms right before a seizure happens (aura) and right after a seizure happens. Symptoms of an aura include:  Fear or anxiety.  Nausea.  Feeling like the room is spinning (vertigo).  A feeling of having seen or heard something before (deja vu).  Odd tastes or smells.  Changes in vision, such as seeing flashing lights or spots. Symptoms that follow a seizure include:  Confusion.  Sleepiness.  Headache. How is this diagnosed? This condition is diagnosed based on:  Your symptoms.  Your medical history.  A physical exam.  A neurological exam. A neurological exam is similar to a physical exam. It involves checking your strength, reflexes, coordination, and sensations.  Tests, such as: ? An electroencephalogram (EEG). This is a painless  test that creates a diagram of your brain waves. ? An MRI of the brain. ? A CT scan of the brain. ? A lumbar puncture, also called a spinal tap. ? Blood tests to check for signs of infection or abnormal blood chemistry. How is this treated? There is no cure for this condition, but treatment can help control seizures. Treatment may involve:  Taking medicines to control seizures. These include medicines to prevent seizures and medicines to stop seizures as they occur.  Having a device called a vagus nerve stimulator implanted in the chest. The device sends electrical impulses to the vagus nerve and to the brain to prevent seizures. This treatment may be recommended if medicines do not help.  Brain surgery. There are several kinds of surgeries that may be done to stop seizures from happening or to reduce  how often seizures happen.  Having regular blood tests. You may need to have blood tests regularly to check that you are getting the right amount of medicine. Once this condition has been diagnosed, it is important to begin treatment as soon as possible. For some people, epilepsy eventually goes away. Follow these instructions at home: Medicines   Take over-the-counter and prescription medicines only as told by your health care provider.  Avoid any substances that may prevent your medicine from working properly, such as alcohol. Activity  Get enough rest. Lack of sleep can make seizures more likely to occur.  Follow instructions from your health care provider about driving, swimming, and doing any other activities that would be dangerous if you had a seizure. Educating others Teach friends and family what to do if you have a seizure. They should:  Lay you on the ground to prevent a fall.  Cushion your head and body.  Loosen any tight clothing around your neck.  Turn you on your side. If vomiting occurs, this helps keep your airway clear.  Stay with you until you recover.  Not  hold you down. Holding you down will not stop the seizure.  Not put anything in your mouth.  Know whether or not you need emergency care. General instructions  Avoid anything that has ever triggered a seizure for you.  Keep a seizure diary. Record what you remember about each seizure, especially anything that might have triggered the seizure.  Keep all follow-up visits as told by your health care provider. This is important. Contact a health care provider if:  Your seizure pattern changes.  You have symptoms of infection or another illness. This might increase your risk of having a seizure. Get help right away if:  You have a seizure that does not stop after 5 minutes.  You have several seizures in a row without a complete recovery in between seizures.  You have a seizure that makes it harder to breathe.  You have a seizure that is different from previous seizures.  You have a seizure that leaves you unable to speak or use a part of your body.  You did not wake up immediately after a seizure. This information is not intended to replace advice given to you by your health care provider. Make sure you discuss any questions you have with your health care provider. Document Released: 01/11/2005 Document Revised: 08/09/2015 Document Reviewed: 07/22/2015 Elsevier Interactive Patient Education  2019 [employer].

## 2018-07-14 ENCOUNTER — Telehealth: Payer: Self-pay | Admitting: Neurology

## 2018-07-14 DIAGNOSIS — G44019 Episodic cluster headache, not intractable: Secondary | ICD-10-CM | POA: Insufficient documentation

## 2018-07-14 DIAGNOSIS — G40219 Localization-related (focal) (partial) symptomatic epilepsy and epileptic syndromes with complex partial seizures, intractable, without status epilepticus: Secondary | ICD-10-CM | POA: Insufficient documentation

## 2018-07-14 NOTE — Telephone Encounter (Signed)
Would you call patient and see where she has been filling the Felbamate? I called [facility] and they do not have it on record so it may be going elsewhere. If she wants it sent to [facility] that is fine, but please get the info where she has been picking it up so I can call and see how many tablets a month she has been prescribes in the past thanks.

## 2018-07-17 MED ORDER — FELBAMATE 600 MG PO TABS: ORAL_TABLET | ORAL | 6 refills | Status: DC

## 2018-07-17 NOTE — Addendum Note (Signed)
Addended by: Sarina Ill B on: 07/17/2018 10:40 AM   Modules accepted: Orders

## 2018-07-17 NOTE — Telephone Encounter (Signed)
Spoke with pt. She stated she had last gotten the Felbamate from [facility]. She said it has now changed to [facility] which is where she would like to have a 3 month supply sent.  I called [facility] mail order pharmacy and spoke with Copywriter, advertising. She stated the Felbamate was last filled on 09/22/2017 with a quantity of 720 tablets (52 day supply).

## 2018-07-19 ENCOUNTER — Other Ambulatory Visit: Payer: Self-pay | Admitting: *Deleted

## 2018-07-19 MED ORDER — FELBAMATE 600 MG PO TABS: ORAL_TABLET | ORAL | 6 refills | Status: DC

## 2018-07-19 NOTE — Telephone Encounter (Signed)
Received instruction request from [facility] for felbamate. Spoke with Dr. Jaynee Eagles. She is keeping the prescription as previously  Prescribed by pt's prior neurologist, 3.5 tablets TID. I updated the prescription and sent it to [facility] per Dr. Jaynee Eagles.

## 2018-08-24 ENCOUNTER — Telehealth: Payer: Self-pay | Admitting: Neurology

## 2018-08-24 NOTE — Telephone Encounter (Addendum)
Called [employer] [place], spoke with Sharyn Lull, pharmacy who stated the patient's [employer] is for medical, not pharmacy.  Will have to call pharmacy next week for insurance information. Office is closed on Fridays.

## 2018-08-24 NOTE — Telephone Encounter (Signed)
Hinton Dyer, can you help?

## 2018-08-24 NOTE — Telephone Encounter (Signed)
Pt states she is having a difficult time finding felbamate (FELBATOL) 600 MG tablet reasonably priced with pharmacies she has checked with.  Pt is asking for a call from RN to discuss if she can get it for a less dose or what suggestions are available to her in getting this medication.  Please call husband(on DPR) Noreene Larsson @ [phone]

## 2018-08-24 NOTE — Telephone Encounter (Signed)
LVM requesting call back with current insurance information so that I can call and try to get cost reduction for her medication.

## 2018-08-24 NOTE — Telephone Encounter (Signed)
Pt returned call and confirmed SubID# and Group# has not changed the only thing that changed was the effective date. Please advise.

## 2018-08-28 NOTE — Telephone Encounter (Addendum)
[place], spoke with Lonn Georgia who stated they file prescriptions through [place], [place] [id number], BIN [id number], [id number], Group: [id number].  Called [facility], spoke with Lucinda who stated the medication is covered. The patient has to pay off her deductible first of $1700/yr (medical+pharmacy) before she can get medication at 20% of cost. Currently she owes $834.77 to meet her deductible. She will need to get medication at cost until she meets deductible, then the pharmacy has to call [facility] [phone] to get over ride for high dose. I thanked her for assistance.  Called patient and advised her of above conversation. She had many questions about her insurance. After a lengthy discussion she felt she understood that she must meet her deductible before she gets a discount on medication. She stated she has been taking it for many years and when trying other medications she failed them. She has "a backlog of medicine" because she takes a total of 7 tabs in a day vs 10 1/2 tabs daily. This is noted in her MAR, dated 07/19/18. She asked how long the medicine is effective. I advised her there is typically expiration date on bottle, and she can ask pharmacist. She stated she will continue taking medicine she has on hand until deductible is met. She will then contact [facility] and [facility] to check prices. She verbalized understanding, appreciation.

## 2018-09-01 DIAGNOSIS — K219 Gastro-esophageal reflux disease without esophagitis: Secondary | ICD-10-CM | POA: Diagnosis not present

## 2018-09-01 DIAGNOSIS — K2930 Chronic superficial gastritis without bleeding: Secondary | ICD-10-CM | POA: Diagnosis not present

## 2018-09-05 ENCOUNTER — Telehealth: Payer: Self-pay | Admitting: Neurology

## 2018-09-05 NOTE — Telephone Encounter (Signed)
That's fine please complete this thanks

## 2018-09-05 NOTE — Telephone Encounter (Signed)
Pt called wanting to discuss the dosage for her felbamate (FELBATOL) 600 MG tablet  Please advise.

## 2018-09-05 NOTE — Telephone Encounter (Signed)
Spoke with the pt. She stated she has been made aware of goodrx and will have huge savings now. She requests to switch the prescription from [facility] to [facility] so she can use the coupon. She also would like the quantity and directions to reflect how she takes the [facility] since the quantity will be lower that way. She takes 3 tablets in the AM, 2 or 3 (sometimes) at lunch depending on whether she feels she needs it, and then takes 3 tablets at bedtime. She understands this will need to be approved by the MD and we will call her back. She verbalized appreciation.

## 2018-09-06 ENCOUNTER — Other Ambulatory Visit: Payer: Self-pay | Admitting: *Deleted

## 2018-09-06 MED ORDER — FELBAMATE 600 MG PO TABS: ORAL_TABLET | ORAL | 5 refills | Status: DC

## 2018-09-06 MED ORDER — FELBAMATE 600 MG PO TABS: ORAL_TABLET | ORAL | 1 refills | Status: DC

## 2018-09-06 NOTE — Telephone Encounter (Signed)
Pt is asking if she can get a 90 day  felbamate (FELBATOL) 600 MG tablet Thru [facility]  Please call

## 2018-09-06 NOTE — Telephone Encounter (Addendum)
Pt husband has returned the call to RN Romelle Starcher, he stated [place], no call back requested

## 2018-09-06 NOTE — Telephone Encounter (Addendum)
Felbamate prescription sent to [facility] per v.o. Dr. Jaynee Eagles. Instructions to take 3 tablets by mouth in the morning, 2-3 tablets at lunch, and 3 tablets at bedtime #270, refills 5. Dr. Jaynee Eagles verbally updated that rx was sent.

## 2018-09-06 NOTE — Telephone Encounter (Signed)
I called [facility] mail delivery and spoke with [place]. Felbatol prescription canceled.

## 2018-09-06 NOTE — Telephone Encounter (Signed)
I called the pt & LVM (ok per DPR) advising Dr. Jaynee Eagles has authorized the adjustment to instructions and quantity of Felbatol. Asked for a call back to let us know which [facility] she would like the prescription sent to.   When the pt calls back, please confirm the pharmacy and add to chart. Thank you.

## 2018-09-06 NOTE — Telephone Encounter (Signed)
Spoke with pt. She stated that using goodrx would be an all cash pay, however if she gets a 90 day supply to [facility] the payment will apply to her deductible. She verbalized appreciation for the call.  Felbatol prescription sent to [facility] and Dr. Jaynee Eagles aware.

## 2018-09-07 ENCOUNTER — Other Ambulatory Visit: Payer: Self-pay | Admitting: Gastroenterology

## 2018-09-07 DIAGNOSIS — R110 Nausea: Secondary | ICD-10-CM

## 2018-09-07 DIAGNOSIS — R1012 Left upper quadrant pain: Secondary | ICD-10-CM

## 2018-09-07 DIAGNOSIS — R634 Abnormal weight loss: Secondary | ICD-10-CM | POA: Diagnosis not present

## 2018-09-07 NOTE — Telephone Encounter (Signed)
No   PAP  program for this RX . I have talked to patient and she is going to pay the cost . [facility] has [facility] to [facility] and RX needs to be for felbamate (FELBATOL) 600 MG tablet  She takes 9 tables daily . Three in am . Three at lunch and three at bed.  Patient is requesting 90 day supply be [facility].

## 2018-09-07 NOTE — Telephone Encounter (Signed)
90 day supply of Felbamate was sent to [facility] yesterday after I spoke with the patient.

## 2018-09-08 DIAGNOSIS — R110 Nausea: Secondary | ICD-10-CM | POA: Diagnosis not present

## 2018-09-13 DIAGNOSIS — R110 Nausea: Secondary | ICD-10-CM | POA: Diagnosis not present

## 2018-09-15 ENCOUNTER — Ambulatory Visit
Admission: RE | Admit: 2018-09-15 | Discharge: 2018-09-15 | Disposition: A | Discharge status: Home / Self Care | Payer: BLUE CROSS/BLUE SHIELD | Source: Ambulatory Visit | Attending: Gastroenterology | Admitting: Gastroenterology

## 2018-09-15 DIAGNOSIS — D1803 Hemangioma of intra-abdominal structures: Secondary | ICD-10-CM | POA: Diagnosis not present

## 2018-09-15 DIAGNOSIS — R110 Nausea: Secondary | ICD-10-CM

## 2018-09-15 DIAGNOSIS — K5730 Diverticulosis of large intestine without perforation or abscess without bleeding: Secondary | ICD-10-CM | POA: Diagnosis not present

## 2018-09-15 DIAGNOSIS — R634 Abnormal weight loss: Secondary | ICD-10-CM

## 2018-09-15 DIAGNOSIS — R1012 Left upper quadrant pain: Secondary | ICD-10-CM

## 2018-09-15 DIAGNOSIS — D259 Leiomyoma of uterus, unspecified: Secondary | ICD-10-CM | POA: Diagnosis not present

## 2018-09-15 MED ORDER — IOPAMIDOL (ISOVUE-300) INJECTION 61%
100.0000 mL | Freq: Once | INTRAVENOUS | Status: AC | PRN
  Administered 2018-09-15: 100 mL via INTRAVENOUS

## 2018-09-20 ENCOUNTER — Encounter: Payer: Self-pay | Admitting: Cardiovascular Disease

## 2018-09-20 DIAGNOSIS — R5383 Other fatigue: Secondary | ICD-10-CM | POA: Diagnosis not present

## 2018-09-20 DIAGNOSIS — R079 Chest pain, unspecified: Secondary | ICD-10-CM | POA: Diagnosis not present

## 2018-09-20 DIAGNOSIS — R1012 Left upper quadrant pain: Secondary | ICD-10-CM | POA: Diagnosis not present

## 2018-09-21 ENCOUNTER — Telehealth: Payer: Self-pay

## 2018-09-21 NOTE — Telephone Encounter (Signed)
NOTES ON FILE FROM COURTNEY WHARTON [phone] SENT REFERRAL TO SCHEDULING

## 2018-09-26 ENCOUNTER — Other Ambulatory Visit (HOSPITAL_COMMUNITY)
Admission: RE | Admit: 2018-09-26 | Discharge: 2018-09-26 | Disposition: A | Discharge status: Home / Self Care | Payer: BLUE CROSS/BLUE SHIELD | Source: Ambulatory Visit | Attending: Cardiovascular Disease | Admitting: Cardiovascular Disease

## 2018-09-26 ENCOUNTER — Ambulatory Visit (INDEPENDENT_AMBULATORY_CARE_PROVIDER_SITE_OTHER): Payer: BLUE CROSS/BLUE SHIELD | Admitting: Cardiovascular Disease

## 2018-09-26 ENCOUNTER — Encounter: Payer: Self-pay | Admitting: Cardiovascular Disease

## 2018-09-26 ENCOUNTER — Other Ambulatory Visit: Payer: Self-pay

## 2018-09-26 VITALS — BP 130/75 | HR 99 | Temp 98.2°F | Ht 66.0 in | Wt 172.4 lb

## 2018-09-26 DIAGNOSIS — R0789 Other chest pain: Secondary | ICD-10-CM | POA: Diagnosis not present

## 2018-09-26 DIAGNOSIS — Z20828 Contact with and (suspected) exposure to other viral communicable diseases: Secondary | ICD-10-CM | POA: Diagnosis not present

## 2018-09-26 DIAGNOSIS — Z01812 Encounter for preprocedural laboratory examination: Secondary | ICD-10-CM | POA: Insufficient documentation

## 2018-09-26 DIAGNOSIS — R079 Chest pain, unspecified: Secondary | ICD-10-CM

## 2018-09-26 DIAGNOSIS — R011 Cardiac murmur, unspecified: Secondary | ICD-10-CM

## 2018-09-26 DIAGNOSIS — E782 Mixed hyperlipidemia: Secondary | ICD-10-CM

## 2018-09-26 DIAGNOSIS — I10 Essential (primary) hypertension: Secondary | ICD-10-CM

## 2018-09-26 LAB — SARS CORONAVIRUS 2 (TAT 6-24 HRS): SARS Coronavirus 2: NEGATIVE

## 2018-09-26 NOTE — Progress Notes (Signed)
Cardiology Office Note:   Date:  09/26/2018  NAME:  Susan Hansen    MRN: [id number] DOB:  [birth year]   PCP:  Marda Stalker, PA-C  Cardiologist:  Evalina Field, MD   Referring MD: Marda Stalker, PA-C    Chief Complaint  Patient presents with  . Chest Pain    History of Present Illness:   Susan Hansen is a 58 y.o. female with a hx of seizure disorder, hypertension, hyperlipidemia who is being seen today for the evaluation of chest pain at the request of Marda Stalker, PA-C.  She reports nearly 2 years of episodic chest pain.  The pain is reported as a pressure underneath her left breast that last seconds.  She is identified no triggers or alleviating factors.  The pain appears to last several seconds and then resolves without any intervention.  She reports no associated symptoms of shortness of breath, palpitations, any other symptoms.  The pain can occur up to 1 time per week.  Of note, she did report she had the pain in her office, and her EKG was without ischemic changes.  She reports a very strong family history of cardiovascular disease, including myocardial infarction in her mother and father in their late 63s.  She is a never smoker, but reports her husband does smoke, but she does reduce her exposure to this.  Her most recent cholesterol profile was quite interesting, with a total cholesterol 307, HDL 91, LDL 204.  She is not on statin therapy.  Most recent TSH is 0.88.  She also reports reports very significant stress in her life.  She is recently lost the life of a friend, had to young children in the family who were killed in a fire, and her father died in June 28, 2022 of this year.  She reports she is quite stressed and depressed over this.  She endorses loss of appetite and loss of weight despite not trying to lose weight.  She reports the reason for the weight loss is she has no appetite.  She is not currently being treated for depression.  She works as a Chemical engineer at [employer], and  has recently returned to work after the coronavirus pandemic.  She reports work is going well.  She endorses no physical limitations that I can see, including no chest pain with exertion or shortness of breath.  She does not have a regular exercise regimen, but has no limitations.  She reports she is had extensive evaluation by her primary care physician including a CT scan of her abdomen for this pain.  Per her report they have identified no identifiable cause for her pain.  Other CVD risk factors include hypertension which is well-controlled on medication.  Past Medical History: Past Medical History:  Diagnosis Date  . Hyperlipidemia   . Hypertension   . Seizures ([place])     Past Surgical History: Past Surgical History:  Procedure Laterality Date  . CESAREAN SECTION      Current Medications: Current Meds  Medication Sig  . Ascorbic Acid (VITAMIN C PO) Take by mouth.  Marland Kitchen b complex vitamins tablet Take 1 tablet by mouth daily.  . Chlorpheniramine Maleate (ALLERGY RELIEF PO) Take 1 tablet by mouth daily. Allertec from [facility]  . Coenzyme Q10 (COQ10 PO) Take by mouth.  . felbamate (FELBATOL) 600 MG tablet Take 3 tablets by mouth in the morning, 2-3 tablets at lunch, and 3 tablets at bedtime.  Marland Kitchen LECITHIN PO Take by mouth.  Marland Kitchen LORazepam (  ATIVAN) 1 MG tablet Please take 1 tablet at onset of aura. May take an additional dose in 5-15 minutes. Max 2mg  a day.  Marland Kitchen. MAGNESIUM PO Take by mouth. At least 400 mg daily  . [DISCONTINUED] lisinopril-hydrochlorothiazide (ZESTORETIC) 20-25 MG tablet Take 1 tablet by mouth daily.  . [DISCONTINUED] S-Adenosylmethionine (SAM-E PO) Take by mouth.     Allergies:    Patient has no known allergies.   Social History: Social History   Socioeconomic History  . Marital status: Married    Spouse name: Not on file  . Number of children: 2  . Years of education: Not on file  . Highest education level: High school graduate  Occupational History  . Not on file   Social Needs  . Financial resource strain: Not on file  . Food insecurity    Worry: Not on file    Inability: Not on file  . Transportation needs    Medical: Not on file    Non-medical: Not on file  Tobacco Use  . Smoking status: Never Smoker  . Smokeless tobacco: Never Used  Substance and Sexual Activity  . Alcohol use: Not Currently    Comment: rare  . Drug use: Never  . Sexual activity: Not on file  Lifestyle  . Physical activity    Days per week: Not on file    Minutes per session: Not on file  . Stress: Not on file  Relationships  . Social Musicianconnections    Talks on phone: Not on file    Gets together: Not on file    Attends religious service: Not on file    Active member of club or organization: Not on file    Attends meetings of clubs or organizations: Not on file    Relationship status: Not on file  Other Topics Concern  . Not on file  Social History Narrative   Lives at home with her husband   Right handed     Family History: The patient's family history includes Diabetes in her father; Heart disease in her father and mother; Hypertension in her father; Stroke in her mother. There is no history of Seizures.  ROS:   All other ROS reviewed and negative. Pertinent positives noted in the HPI.     EKGs/Labs/Other Studies Reviewed:   The following studies were personally reviewed by me today:  Labs from primary care physician dated 06/22/2018: Total cholesterol 307 HDL cholesterol 91, LDL cholesterol 204, triglycerides 61, serum creatinine 0.73, TSH 0.88  EKG:  EKG is ordered today.  The ekg ordered today demonstrates normal sinus rhythm, heart rate 99, normal intervals, no ischemic ST changes, no evidence of prior infarction, and was personally reviewed by me.   Recent Labs: 12/16/2017: BUN 14; Creatinine, Ser 0.50; Hemoglobin 14.6; Platelets 165; Potassium 4.1; Sodium 139   Recent Lipid Panel No results found for: CHOL, TRIG, HDL, CHOLHDL, VLDL, LDLCALC,  LDLDIRECT  Physical Exam:   VS:  BP 130/75   Pulse 99   Temp 98.2 F (36.8 C)   Ht 5\' 6"  (1.676 m)   Wt 172 lb 6.4 oz (78.2 kg)   SpO2 95%   BMI 27.83 kg/m    Wt Readings from Last 3 Encounters:  09/26/18 172 lb 6.4 oz (78.2 kg)  07/13/18 185 lb (83.9 kg)  12/14/17 197 lb 6 oz (89.5 kg)    General: Well nourished, well developed, in no acute distress Heart: Atraumatic, normal size  Eyes: PEERLA, EOMI  Neck: Supple, no  JVD Endocrine: No thryomegaly Cardiac: Normal S1, S2; faint 2 out of 6 ejection murmur radiating to carotids Lungs: Clear to auscultation bilaterally, no wheezing, rhonchi or rales  Abd: Soft, nontender, no hepatomegaly  Ext: No edema, pulses 2+ Musculoskeletal: No deformities, BUE and BLE strength normal and equal Skin: Warm and dry, no rashes   Neuro: Alert and oriented to person, place, time, and situation, CNII-XII grossly intact, no focal deficits  Psych: Normal mood and affect   ASSESSMENT:   NAME@ is a 58 y.o. female who presents for the following: 1. Chest pain of uncertain etiology   2. Essential hypertension   3. Mixed hyperlipidemia   4. Murmur     PLAN:   1. Chest pain of uncertain etiology -She has a history of very atypical chest pain.  She does have risk factors for cardiovascular disease including hyperlipidemia, and family history.  Her EKG is normal without ischemic changes.  Due to her recent CT scan, I think it is reasonable to proceed with a plain old exercise treadmill test at this time.  I have a low suspicion that this represents coronary disease, but we can start with this test first.  I think if there is any inconclusiveness to this test, we can discuss a cardiac CTA. -I have encouraged her to talk with her primary care physician about the possibility depression.  She screens very positive given her symptoms of poor concentration, weight loss, loss of appetite, depressed mood.  She will do this.  2. Essential hypertension -Blood  pressure is well controlled today, no changes to medications  3. Mixed hyperlipidemia -Strahl profile was kind of difficult to believe with a total cholesterol of 307 HDL cholesterol 91 LDL cholesterol 204 -She is not on statin therapy but will qualify based on LDL -I think we will just repeat her cholesterol profile and then decide on therapy at our next visit  4. Cardiac murmur -She has a faint 2 out of 6 systolic ejection murmur radiating into the carotid arteries, we will obtain a resting echocardiogram as I suspect she possibly has aortic sclerosis or mild AS  Disposition: Return in about 2 months (around 11/26/2018).  Medication Adjustments/Labs and Tests Ordered: Current medicines are reviewed at length with the patient today.  Concerns regarding medicines are outlined above.  Orders Placed This Encounter  Procedures  . Novel Coronavirus, NAA ([facility])  . Lipid panel  . Exercise Tolerance Test  . EKG 12-Lead  . ECHOCARDIOGRAM COMPLETE   No orders of the defined types were placed in this encounter.   Patient Instructions  Medication Instructions:  Continue same medications   Lab work: Lipid Panel  Testing/Procedures: Schedule Treadmill  (POET )  Covid drive thru test 3 days before treadmill at [facility] site Pre Procedure Line  Schedule Echo    Follow-Up: At [facility], you and your health needs are our priority.  As part of our continuing mission to provide you with exceptional heart care, we have created designated Provider Care Teams.  These Care Teams include your primary Cardiologist (physician) and Advanced Practice Providers (APPs -  Physician Assistants and Nurse Practitioners) who all work together to provide you with the care you need, when you need it. . Schedule follow up appointment in 2 months      Signed, Gerri Spore T. Flora Lipps, MD [facility]  [address] [place], [place] [address] [phone]  09/26/2018 8:59  AM

## 2018-09-26 NOTE — Patient Instructions (Addendum)
Medication Instructions:  Continue same medications   Lab work: Lipid Panel  Testing/Procedures: Schedule Treadmill  (POET )  Covid drive thru test 3 days before treadmill at [facility] site Pre Procedure Line  Schedule Echo    Follow-Up: At [facility], you and your health needs are our priority.  As part of our continuing mission to provide you with exceptional heart care, we have created designated Provider Care Teams.  These Care Teams include your primary Cardiologist (physician) and Advanced Practice Providers (APPs -  Physician Assistants and Nurse Practitioners) who all work together to provide you with the care you need, when you need it. . Schedule follow up appointment in 2 months

## 2018-09-27 ENCOUNTER — Telehealth (HOSPITAL_COMMUNITY): Payer: Self-pay

## 2018-09-27 NOTE — Telephone Encounter (Signed)
Encounter complete. 

## 2018-09-29 ENCOUNTER — Other Ambulatory Visit: Payer: Self-pay

## 2018-09-29 ENCOUNTER — Ambulatory Visit (HOSPITAL_BASED_OUTPATIENT_CLINIC_OR_DEPARTMENT_OTHER): Payer: BLUE CROSS/BLUE SHIELD

## 2018-09-29 ENCOUNTER — Ambulatory Visit (HOSPITAL_COMMUNITY)
Admission: RE | Admit: 2018-09-29 | Discharge: 2018-09-29 | Disposition: A | Discharge status: Home / Self Care | Payer: BLUE CROSS/BLUE SHIELD | Source: Ambulatory Visit | Attending: Cardiology | Admitting: Cardiology

## 2018-09-29 DIAGNOSIS — E782 Mixed hyperlipidemia: Secondary | ICD-10-CM

## 2018-09-29 DIAGNOSIS — R0789 Other chest pain: Secondary | ICD-10-CM

## 2018-09-29 DIAGNOSIS — I10 Essential (primary) hypertension: Secondary | ICD-10-CM | POA: Diagnosis not present

## 2018-09-29 DIAGNOSIS — R079 Chest pain, unspecified: Secondary | ICD-10-CM

## 2018-09-30 LAB — LIPID PANEL
Chol/HDL Ratio: 4.2 ratio (ref 0.0–4.4)
Cholesterol, Total: 312 mg/dL — ABNORMAL HIGH (ref 100–199)
HDL: 75 mg/dL (ref >39)
LDL Chol Calc (NIH): 216 mg/dL — ABNORMAL HIGH (ref 0–99)
Triglycerides: 119 mg/dL (ref 0–149)
VLDL Cholesterol Cal: 21 mg/dL (ref 5–40)

## 2018-10-03 LAB — EXERCISE TOLERANCE TEST
Estimated workload: 8.5 METS
Exercise duration (min): 7 min
Exercise duration (sec): 1 s
MPHR: 160 {beats}/min
Peak HR: 157 {beats}/min
Percent HR: 98 %
Rest HR: 81 {beats}/min

## 2018-10-04 ENCOUNTER — Telehealth: Payer: Self-pay | Admitting: Cardiovascular Disease

## 2018-10-04 MED ORDER — ROSUVASTATIN CALCIUM 40 MG PO TABS: 40.0000 mg | ORAL_TABLET | Freq: Every day | ORAL | 3 refills | Status: DC

## 2018-10-04 NOTE — Telephone Encounter (Signed)
Called Mrs. Rollins to let her know that her ECG stress test was normal. Plan to start crestor 40 mg QHS. She was in agreement with this.   Evalina Field, MD

## 2018-10-06 ENCOUNTER — Other Ambulatory Visit: Payer: Self-pay | Admitting: Family Medicine

## 2018-10-06 DIAGNOSIS — Z1231 Encounter for screening mammogram for malignant neoplasm of breast: Secondary | ICD-10-CM

## 2018-11-20 ENCOUNTER — Ambulatory Visit
Admission: RE | Admit: 2018-11-20 | Discharge: 2018-11-20 | Disposition: A | Discharge status: Home / Self Care | Payer: BLUE CROSS/BLUE SHIELD | Source: Ambulatory Visit | Attending: Family Medicine | Admitting: Family Medicine

## 2018-11-20 ENCOUNTER — Other Ambulatory Visit: Payer: Self-pay

## 2018-11-20 DIAGNOSIS — Z1231 Encounter for screening mammogram for malignant neoplasm of breast: Secondary | ICD-10-CM

## 2018-11-21 ENCOUNTER — Other Ambulatory Visit: Payer: Self-pay | Admitting: Family Medicine

## 2018-11-21 DIAGNOSIS — R928 Other abnormal and inconclusive findings on diagnostic imaging of breast: Secondary | ICD-10-CM

## 2018-11-23 ENCOUNTER — Other Ambulatory Visit: Payer: Self-pay | Admitting: Family Medicine

## 2018-11-23 ENCOUNTER — Other Ambulatory Visit: Payer: Self-pay

## 2018-11-23 ENCOUNTER — Ambulatory Visit
Admission: RE | Admit: 2018-11-23 | Discharge: 2018-11-23 | Disposition: A | Discharge status: Home / Self Care | Payer: BLUE CROSS/BLUE SHIELD | Source: Ambulatory Visit | Attending: Family Medicine | Admitting: Family Medicine

## 2018-11-23 DIAGNOSIS — R928 Other abnormal and inconclusive findings on diagnostic imaging of breast: Secondary | ICD-10-CM

## 2018-11-23 DIAGNOSIS — N6320 Unspecified lump in the left breast, unspecified quadrant: Secondary | ICD-10-CM

## 2018-11-23 DIAGNOSIS — N6489 Other specified disorders of breast: Secondary | ICD-10-CM | POA: Diagnosis not present

## 2018-11-30 ENCOUNTER — Ambulatory Visit
Admission: RE | Admit: 2018-11-30 | Discharge: 2018-11-30 | Disposition: A | Discharge status: Home / Self Care | Payer: BLUE CROSS/BLUE SHIELD | Source: Ambulatory Visit | Attending: Family Medicine | Admitting: Family Medicine

## 2018-11-30 ENCOUNTER — Other Ambulatory Visit: Payer: Self-pay

## 2018-11-30 DIAGNOSIS — N6321 Unspecified lump in the left breast, upper outer quadrant: Secondary | ICD-10-CM | POA: Diagnosis not present

## 2018-11-30 DIAGNOSIS — N6320 Unspecified lump in the left breast, unspecified quadrant: Secondary | ICD-10-CM

## 2018-11-30 DIAGNOSIS — N6012 Diffuse cystic mastopathy of left breast: Secondary | ICD-10-CM | POA: Diagnosis not present

## 2018-12-03 NOTE — Progress Notes (Signed)
Cardiology Office Note:   Date:  12/04/2018  NAME:  Susan Hansen    MRN: [id number] DOB:  [birth year]   PCP:  Jarrett SohoWharton, Courtney, PA-C  Cardiologist:  Reatha HarpsWesley T O'Neal, MD   Referring MD: Jarrett SohoWharton, Courtney, PA-C   Chief Complaint  Patient presents with  . Hyperlipidemia    History of Present Illness:   Susan Hansen is a 58 y.o. female with a hx of hypertension, HLD who presents for follow-up. Recent normal ETT. Echocardiogram unremarkable. Lab work showed LDL 216, HDL 75. Started on crestor 40 mg QHS.  She reports she is tolerating the Crestor well.  She describes no muscle aches or cramps.  She also reports that the chest pain she had in the past seem to have resolved.  She has started to become more active, completing 10,000 steps daily.  She monitors this on her Fitbit.  When completing up to 10,000 steps daily she describes no chest pain or shortness of breath.  She does have a faint systolic murmur, however her cardiac ultrasound was normal.  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  I recently saw her husband who is doing well.  She does express concerns about her husband's continued smoking.  He is trying to quit.   Past Medical History: Past Medical History:  Diagnosis Date  . Hyperlipidemia   . Hypertension   . Seizures ([facility])     Past Surgical History: Past Surgical History:  Procedure Laterality Date  . CESAREAN SECTION      Current Medications: Current Meds  Medication Sig  . Ascorbic Acid (VITAMIN C PO) Take by mouth.  Marland Kitchen. b complex vitamins tablet Take 1 tablet by mouth daily.  . Chlorpheniramine Maleate (ALLERGY RELIEF PO) Take 1 tablet by mouth daily. Allertec from [employer]  . Coenzyme Q10 (COQ10 PO) Take by mouth.  . felbamate (FELBATOL) 600 MG tablet Take 3 tablets by mouth in the morning, 2-3 tablets at lunch, and 3 tablets at bedtime.  Marland Kitchen. LECITHIN PO Take by mouth.  Marland Kitchen. lisinopril (ZESTRIL) 20 MG tablet Take 20 mg by mouth daily.   Marland Kitchen. LORazepam  (ATIVAN) 1 MG tablet Please take 1 tablet at onset of aura. May take an additional dose in 5-15 minutes. Max 2mg  a day.  Marland Kitchen. MAGNESIUM PO Take by mouth. At least 400 mg daily  . rosuvastatin (CRESTOR) 40 MG tablet Take 1 tablet (40 mg total) by mouth daily.     Allergies:    Patient has no known allergies.   Social History: Social History   Socioeconomic History  . Marital status: Married    Spouse name: Not on file  . Number of children: 2  . Years of education: Not on file  . Highest education level: High school graduate  Occupational History  . Not on file  Social Needs  . Financial resource strain: Not on file  . Food insecurity    Worry: Not on file    Inability: Not on file  . Transportation needs    Medical: Not on file    Non-medical: Not on file  Tobacco Use  . Smoking status: Never Smoker  . Smokeless tobacco: Never Used  Substance and Sexual Activity  . Alcohol use: Not Currently    Comment: rare  . Drug use: Never  . Sexual activity: Not on file  Lifestyle  . Physical activity    Days per week: Not on file    Minutes per session: Not  on file  . Stress: Not on file  Relationships  . Social Herbalist on phone: Not on file    Gets together: Not on file    Attends religious service: Not on file    Active member of club or organization: Not on file    Attends meetings of clubs or organizations: Not on file    Relationship status: Not on file  Other Topics Concern  . Not on file  Social History Narrative   Lives at home with her husband   Right handed     Family History: The patient's family history includes Breast cancer in her paternal grandmother; Diabetes in her father; Heart disease in her father and mother; Hypertension in her father; Stroke in her mother. There is no history of Seizures.  ROS:   All other ROS reviewed and negative. Pertinent positives noted in the HPI.     EKGs/Labs/Other Studies Reviewed:   The following studies  were personally reviewed by me today:  ETT 09/29/2018  There was no ST segment deviation noted during stress.   This is a negative exercise ECG treadmill test.  There were no EKG changes concerning for ischemia.  The patient achieved 8.5 METS, indicating average functional capacity.  The study was stopped due to shortness of breath/fatigue.  Overall, the low-risk study.  Echo 09/29/2018:  1. The left ventricle has normal systolic function, with an ejection fraction of 55-60%. The cavity size was normal. There is mild concentric left ventricular hypertrophy. Left ventricular diastolic parameters were normal. No evidence of left  ventricular regional wall motion abnormalities.  2. The right ventricle has normal systolic function. The cavity was normal. There is no increase in right ventricular wall thickness. Right ventricular systolic pressure is normal.  3. Left atrial size was mildly dilated.  4. The aorta is normal unless otherwise noted.  Recent Labs: 12/16/2017: BUN 14; Creatinine, Ser 0.50; Hemoglobin 14.6; Platelets 165; Potassium 4.1; Sodium 139   Recent Lipid Panel    Component Value Date/Time   CHOL 312 (H) 09/29/2018 1542   TRIG 119 09/29/2018 1542   HDL 75 09/29/2018 1542   CHOLHDL 4.2 09/29/2018 1542   LDLCALC 216 (H) 09/29/2018 1542    Physical Exam:   VS:  BP 128/80   Pulse 79   Temp (!) 97.5 F (36.4 C) (Temporal)   Ht 5\' 8"  (1.727 m)   Wt 175 lb (79.4 kg)   SpO2 99%   BMI 26.61 kg/m    Wt Readings from Last 3 Encounters:  12/04/18 175 lb (79.4 kg)  09/26/18 172 lb 6.4 oz (78.2 kg)  07/13/18 185 lb (83.9 kg)    General: Well nourished, well developed, in no acute distress Heart: Atraumatic, normal size  Eyes: PEERLA, EOMI  Neck: Supple, no JVD Endocrine: No thryomegaly Cardiac: Faint 2 out of 6 systolic ejection murmur Lungs: Clear to auscultation bilaterally, no wheezing, rhonchi or rales  Abd: Soft, nontender, no hepatomegaly  Ext: No edema, pulses 2+  Musculoskeletal: No deformities, BUE and BLE strength normal and equal Skin: Warm and dry, no rashes   Neuro: Alert and oriented to person, place, time, and situation, CNII-XII grossly intact, no focal deficits  Psych: Normal mood and affect   ASSESSMENT:   Susan Hansen is a 58 y.o. female who presents for the following: 1. Precordial pain   2. Mixed hyperlipidemia   3. Essential hypertension     PLAN:   1. Precordial pain -Recently  completed a normal exercise treadmill test without ischemic changes -Recent echocardiogram normal  2. Mixed hyperlipidemia -Most recent lipid profile shows LDL cholesterol 216 and HDL 75 -I have asked her to inquire about family members with significant elevated cholesterol levels -We started on Crestor at her last visit and I will check a lipid profile with direct LDL today -This is worrisome for possibly familial hypercholesterolemia -We will reassess her need to be referred to lipid clinic on today's profile  3. Essential hypertension -Well-controlled today, will continue her home lisinopril   Disposition: Return in about 3 months (around 03/06/2019).  Medication Adjustments/Labs and Tests Ordered: Current medicines are reviewed at length with the patient today.  Concerns regarding medicines are outlined above.  Orders Placed This Encounter  Procedures  . Lipid panel  . Hemoglobin A1c  . Direct LDL   No orders of the defined types were placed in this encounter.   Patient Instructions  Medication Instructions:  Your physician recommends that you continue on your current medications as directed. Please refer to the Current Medication list given to you today.  If you need a refill on your cardiac medications before your next appointment, please call your pharmacy.   Lab work: Lipids, Direct LDL, Hemoglobin A1C If you have labs (blood work) drawn today and your tests are completely normal, you will receive your results only by: MyChart  Message (if you have MyChart) OR A paper copy in the mail If you have any lab test that is abnormal or we need to change your treatment, we will call you to review the results.  Testing/Procedures: NONE  Follow-Up: At [facility], you and your health needs are our priority.  As part of our continuing mission to provide you with exceptional heart care, we have created designated Provider Care Teams.  These Care Teams include your primary Cardiologist (physician) and Advanced Practice Providers (APPs -  Physician Assistants and Nurse Practitioners) who all work together to provide you with the care you need, when you need it. You may see Reatha Harps, MD or one of the following Advanced Practice Providers on your designated Care Team:    Azalee Course, PA-C  Micah Flesher, [place] or   Judy Pimple, [place]   Your physician wants you to follow-up in: 3 months. You will receive a reminder letter in the mail two months in advance. If you don't receive a letter, please call our office to schedule the follow-up appointment.          Signed, Lenna Gilford. Flora Lipps, MD [facility]  [address] [place], [place] [address] [phone]  12/04/2018 8:23 AM

## 2018-12-04 ENCOUNTER — Encounter: Payer: Self-pay | Admitting: Cardiovascular Disease

## 2018-12-04 ENCOUNTER — Other Ambulatory Visit: Payer: Self-pay

## 2018-12-04 ENCOUNTER — Ambulatory Visit (INDEPENDENT_AMBULATORY_CARE_PROVIDER_SITE_OTHER): Payer: BLUE CROSS/BLUE SHIELD | Admitting: Cardiovascular Disease

## 2018-12-04 VITALS — BP 128/80 | HR 79 | Temp 97.5°F | Ht 68.0 in | Wt 175.0 lb

## 2018-12-04 DIAGNOSIS — R072 Precordial pain: Secondary | ICD-10-CM

## 2018-12-04 DIAGNOSIS — I10 Essential (primary) hypertension: Secondary | ICD-10-CM

## 2018-12-04 DIAGNOSIS — E782 Mixed hyperlipidemia: Secondary | ICD-10-CM

## 2018-12-04 NOTE — Patient Instructions (Signed)
Medication Instructions:  Your physician recommends that you continue on your current medications as directed. Please refer to the Current Medication list given to you today.  If you need a refill on your cardiac medications before your next appointment, please call your pharmacy.   Lab work: Lipids, Direct LDL, Hemoglobin A1C If you have labs (blood work) drawn today and your tests are completely normal, you will receive your results only by: [place] (if you have MyChart) OR A paper copy in the mail If you have any lab test that is abnormal or we need to change your treatment, we will call you to review the results.  Testing/Procedures: NONE  Follow-Up: At [facility], you and your health needs are our priority.  As part of our continuing mission to provide you with exceptional heart care, we have created designated Provider Care Teams.  These Care Teams include your primary Cardiologist (physician) and Advanced Practice Providers (APPs -  Physician Assistants and Nurse Practitioners) who all work together to provide you with the care you need, when you need it. You may see Evalina Field, MD or one of the following Advanced Practice Providers on your designated Care Team:    Almyra Deforest, PA-C  Fabian Sharp, [place] or   Roby Lofts, [place]   Your physician wants you to follow-up in: 3 months. You will receive a reminder letter in the mail two months in advance. If you don't receive a letter, please call our office to schedule the follow-up appointment.

## 2018-12-05 ENCOUNTER — Telehealth: Payer: Self-pay | Admitting: Cardiovascular Disease

## 2018-12-05 LAB — LIPID PANEL
Chol/HDL Ratio: 2.3 ratio (ref 0.0–4.4)
Cholesterol, Total: 199 mg/dL (ref 100–199)
HDL: 88 mg/dL (ref >39)
LDL Chol Calc (NIH): 102 mg/dL — ABNORMAL HIGH (ref 0–99)
Triglycerides: 49 mg/dL (ref 0–149)
VLDL Cholesterol Cal: 9 mg/dL (ref 5–40)

## 2018-12-05 LAB — HEMOGLOBIN A1C
Est. average glucose Bld gHb Est-mCnc: 91 mg/dL
Hgb A1c MFr Bld: 4.8 % (ref 4.8–5.6)

## 2018-12-05 LAB — LDL CHOLESTEROL, DIRECT: LDL Direct: 111 mg/dL — ABNORMAL HIGH (ref 0–99)

## 2018-12-05 NOTE — Telephone Encounter (Signed)
Attempted to call Mrs. Bocock about LDL.Looks great. We will continue current statin. Left message.   [place] T. Audie Box, [place]  [address], [place], [place] [address] [phone]  10:13 AM

## 2019-02-08 DIAGNOSIS — R1012 Left upper quadrant pain: Secondary | ICD-10-CM | POA: Diagnosis not present

## 2019-02-08 DIAGNOSIS — R079 Chest pain, unspecified: Secondary | ICD-10-CM | POA: Diagnosis not present

## 2019-02-08 DIAGNOSIS — I10 Essential (primary) hypertension: Secondary | ICD-10-CM | POA: Diagnosis not present

## 2019-03-06 NOTE — Progress Notes (Signed)
Cardiology Office Note:   Date:  03/08/2019  NAME:  Susan Hansen    MRN: [id number] DOB:  Apr 01, 1960   PCP:  Jarrett Soho, PA-C  Cardiologist:  Reatha Harps, MD   Referring MD: Jarrett Soho, PA-C   Chief Complaint  Patient presents with  . Hyperlipidemia   History of Present Illness:   Susan Hansen is a 59 y.o. female with a hx of hypertension, hyperlipidemia who presents for follow-up of hyperlipidemia.  She reports she is largely doing well.  She has been taking Crestor 40 mg daily but apparently had leg pain in her right lower extremity.  She reports she took the Crestor for 2 weeks and then stop.  Surprisingly her most recent lipid profile in November was phenomenal.  She reports she is exercising daily up to 15,000 steps.  She has no chest pain or shortness of breath.  She did recently experience a fall at work and does have some left-sided chest pain but this is strictly musculoskeletal and improving.  She reports today she feels a little better.  She reports deep breathing has helped it.  Her husband is a patient of mine who continues to smoke and do activities he should not.  She is stressed about this.  She is also stressed about her overall state of health.  I did reassure her that her stress test was very normal and her lipid profile recently shows a very high HDL.  She denies chest pain, shortness of breath, palpitations today.  Regarding her hypertension, her blood pressure is a bit elevated today 158/82.  She does report she has been a bit stressed out about this appointment.  Blood pressure values have been within normal limits on recent checks.  Problem List 1. HTN 2. HLD -09/2018: T chol 312 HDL 75 LDL 216 -11/2018: T chol 199 HDL 88 LDL 102  Past Medical History: Past Medical History:  Diagnosis Date  . Hyperlipidemia   . Hypertension   . Seizures (HCC)     Past Surgical History: Past Surgical History:  Procedure Laterality Date  . CESAREAN SECTION       Current Medications: Current Meds  Medication Sig  . Ascorbic Acid (VITAMIN C PO) Take by mouth.  Marland Kitchen b complex vitamins tablet Take 1 tablet by mouth daily.  . Chlorpheniramine Maleate (ALLERGY RELIEF PO) Take 1 tablet by mouth daily. Allertec from [employer]  . Coenzyme Q10 (COQ10 PO) Take by mouth.  . felbamate (FELBATOL) 600 MG tablet Take 3 tablets by mouth in the morning, 2-3 tablets at lunch, and 3 tablets at bedtime.  . Flaxseed, Linseed, (FLAX SEED OIL PO) Take 1,200 mg by mouth daily.  Marland Kitchen LECITHIN PO Take by mouth.  Marland Kitchen lisinopril (ZESTRIL) 20 MG tablet Take 20 mg by mouth daily.   Marland Kitchen LORazepam (ATIVAN) 1 MG tablet Please take 1 tablet at onset of aura. May take an additional dose in 5-15 minutes. Max 2mg  a day.  MAGNESIUM PO Take by mouth. At least 400 mg daily  . Red Yeast Rice Extract 300 MG CAPS Take 1 tablet by mouth daily.     Allergies:    Patient has no known allergies.   Social History: Social History   Socioeconomic History  . Marital status: Married    Spouse name: Not on file  . Number of children: 2  . Years of education: Not on file  . Highest education level: High school graduate  Occupational History  . Not  on file  Tobacco Use  . Smoking status: Never Smoker  . Smokeless tobacco: Never Used  Substance and Sexual Activity  . Alcohol use: Not Currently    Comment: rare  . Drug use: Never  . Sexual activity: Not on file  Other Topics Concern  . Not on file  Social History Narrative   Lives at home with her husband   Right handed   Social Determinants of Health   Financial Resource Strain:   . Difficulty of Paying Living Expenses: Not on file  Food Insecurity:   . Worried About Programme researcher, broadcasting/film/video in the Last Year: Not on file  . Ran Out of Food in the Last Year: Not on file  Transportation Needs:   . Lack of Transportation (Medical): Not on file  . Lack of Transportation (Non-Medical): Not on file  Physical Activity:   . Days of Exercise  per Week: Not on file  . Minutes of Exercise per Session: Not on file  Stress:   . Feeling of Stress : Not on file  Social Connections:   . Frequency of Communication with Friends and Family: Not on file  . Frequency of Social Gatherings with Friends and Family: Not on file  . Attends Religious Services: Not on file  . Active Member of Clubs or Organizations: Not on file  . Attends Banker Meetings: Not on file  . Marital Status: Not on file     Family History: The patient's family history includes Breast cancer in her paternal grandmother; Diabetes in her father; Heart disease in her father and mother; Hypertension in her father; Stroke in her mother. There is no history of Seizures.  ROS:   All other ROS reviewed and negative. Pertinent positives noted in the HPI.     EKGs/Labs/Other Studies Reviewed:   The following studies were personally reviewed by me today:  ETT 09/29/2018  There was no ST segment deviation noted during stress.   This is a negative exercise ECG treadmill test.  There were no EKG changes concerning for ischemia. 7 min 01 sec.  The patient achieved 8.5 METS, indicating average functional capacity.  The study was stopped due to shortness of breath/fatigue.  Overall, the low-risk study.  Recent Labs: No results found for requested labs within last 8760 hours.   Recent Lipid Panel    Component Value Date/Time   CHOL 199 12/04/2018 0846   TRIG 49 12/04/2018 0846   HDL 88 12/04/2018 0846   CHOLHDL 2.3 12/04/2018 0846   LDLCALC 102 (H) 12/04/2018 0846   LDLDIRECT 111 (H) 12/04/2018 0846    Physical Exam:   VS:  BP (!) 158/82   Pulse 74   Ht 5\' 8"  (1.727 m)   Wt 171 lb 9.6 oz (77.8 kg)   SpO2 98%   BMI 26.09 kg/m    Wt Readings from Last 3 Encounters:  03/08/19 171 lb 9.6 oz (77.8 kg)  12/04/18 175 lb (79.4 kg)  09/26/18 172 lb 6.4 oz (78.2 kg)    General: Well nourished, well developed, in no acute distress Heart: Atraumatic, normal  size  Eyes: PEERLA, EOMI  Neck: Supple, no JVD Endocrine: No thryomegaly Cardiac: Normal S1, S2; RRR; no murmurs, rubs, or gallops Lungs: Clear to auscultation bilaterally, no wheezing, rhonchi or rales  Abd: Soft, nontender, no hepatomegaly  Ext: No edema, pulses 2+ Musculoskeletal: No deformities, BUE and BLE strength normal and equal Skin: Warm and dry, no rashes   Neuro: Alert  and oriented to person, place, time, and situation, CNII-XII grossly intact, no focal deficits  Psych: Normal mood and affect   ASSESSMENT:   Susan Hansen is a 59 y.o. female who presents for the following: 1. Mixed hyperlipidemia   2. Essential hypertension     PLAN:   1. Mixed hyperlipidemia -Most recent LDL cholesterol 111 and HDL 88.  She has stopped her Crestor.  She did have an LDL cholesterol in the 200 range but this apparently has come down without medications. -I think we will recheck today to see where she is.  The 200 could be an inaccurate result  -We will then decide if she needs to be on statin therapy based on results from today  2. Essential hypertension -A bit elevated today.  Reports recent fall and pain.  Also reports anxiety.  No change in medications today.   Disposition: Return in about 1 year (around 03/07/2020).  Medication Adjustments/Labs and Tests Ordered: Current medicines are reviewed at length with the patient today.  Concerns regarding medicines are outlined above.  Orders Placed This Encounter  Procedures  . Lipid panel   No orders of the defined types were placed in this encounter.   Patient Instructions  Medication Instructions:  The current medical regimen is effective;  continue present plan and medications.  *If you need a refill on your cardiac medications before your next appointment, please call your pharmacy*  Lab Work: Lipid today  If you have labs (blood work) drawn today and your tests are completely normal, you will receive your results only  by: Marland Kitchen MyChart Message (if you have MyChart) OR . A paper copy in the mail If you have any lab test that is abnormal or we need to change your treatment, we will call you to review the results.  Follow-Up: At [facility], you and your health needs are our priority.  As part of our continuing mission to provide you with exceptional heart care, we have created designated Provider Care Teams.  These Care Teams include your primary Cardiologist (physician) and Advanced Practice Providers (APPs -  Physician Assistants and Nurse Practitioners) who all work together to provide you with the care you need, when you need it.  Your next appointment:   12 month(s)  The format for your next appointment:   In Person  Provider:   Eleonore Chiquito, MD        Signed, Addison Naegeli. Audie Box, [place]  [address], [place], [place] [address] [phone]  03/08/2019 8:50 AM

## 2019-03-08 ENCOUNTER — Ambulatory Visit (INDEPENDENT_AMBULATORY_CARE_PROVIDER_SITE_OTHER): Payer: 59 | Admitting: Cardiovascular Disease

## 2019-03-08 ENCOUNTER — Encounter: Payer: Self-pay | Admitting: Cardiovascular Disease

## 2019-03-08 ENCOUNTER — Other Ambulatory Visit: Payer: Self-pay

## 2019-03-08 VITALS — BP 158/82 | HR 74 | Ht 68.0 in | Wt 171.6 lb

## 2019-03-08 DIAGNOSIS — E782 Mixed hyperlipidemia: Secondary | ICD-10-CM | POA: Diagnosis not present

## 2019-03-08 DIAGNOSIS — I10 Essential (primary) hypertension: Secondary | ICD-10-CM

## 2019-03-08 LAB — LIPID PANEL
Chol/HDL Ratio: 2.7 ratio (ref 0.0–4.4)
Cholesterol, Total: 254 mg/dL — ABNORMAL HIGH (ref 100–199)
HDL: 93 mg/dL (ref >39)
LDL Chol Calc (NIH): 153 mg/dL — ABNORMAL HIGH (ref 0–99)
Triglycerides: 53 mg/dL (ref 0–149)
VLDL Cholesterol Cal: 8 mg/dL (ref 5–40)

## 2019-03-08 NOTE — Patient Instructions (Signed)
Medication Instructions:  The current medical regimen is effective;  continue present plan and medications.  *If you need a refill on your cardiac medications before your next appointment, please call your pharmacy*  Lab Work: Lipid today  If you have labs (blood work) drawn today and your tests are completely normal, you will receive your results only by: Marland Kitchen MyChart Message (if you have MyChart) OR . A paper copy in the mail If you have any lab test that is abnormal or we need to change your treatment, we will call you to review the results.  Follow-Up: At [facility], you and your health needs are our priority.  As part of our continuing mission to provide you with exceptional heart care, we have created designated Provider Care Teams.  These Care Teams include your primary Cardiologist (physician) and Advanced Practice Providers (APPs -  Physician Assistants and Nurse Practitioners) who all work together to provide you with the care you need, when you need it.  Your next appointment:   12 month(s)  The format for your next appointment:   In Person  Provider:   Lennie Odor, MD

## 2019-04-13 ENCOUNTER — Telehealth: Payer: Self-pay | Admitting: Neurology

## 2019-04-13 NOTE — Telephone Encounter (Signed)
Pt is asking for a refill on her felbamate (FELBATOL) 600 MG tablet [facility]  Below is pt's new insurance information she would like the medication applied to [employer]-([id number])  [phone] Member EP#[id number] [phone] Member: Thurmon Fair Morad(husband) [facility] XNA#[id number] RX PCN: Lolita Patella Group: SNR Issuer [phone]) [id number] BT#[id number]

## 2019-04-16 MED ORDER — FELBAMATE 600 MG PO TABS: ORAL_TABLET | ORAL | 0 refills | Status: DC

## 2019-04-16 NOTE — Telephone Encounter (Signed)
Refill x 3 month supply (as before) sent to [facility]. Pending f/u noted to be on 07/19/19 @ 2:00 pm.

## 2019-04-16 NOTE — Addendum Note (Signed)
Addended by: Bertram Savin on: 04/16/2019 12:08 PM   Modules accepted: Orders

## 2019-04-24 ENCOUNTER — Telehealth: Payer: Self-pay | Admitting: *Deleted

## 2019-04-24 NOTE — Telephone Encounter (Signed)
Called pt & LVM advising provider out of office on 6/24. Asked for call back to reschedule her appointment to another day. Dr. Lucia Gaskins had this appt as a 1 hour. Please r/s when pt calls back.

## 2019-07-19 ENCOUNTER — Ambulatory Visit: Payer: BLUE CROSS/BLUE SHIELD | Admitting: Neurology

## 2019-08-01 NOTE — Progress Notes (Signed)
[facility]    Provider:  Dr Lucia Gaskins Requesting Provider: Jarrett Soho, PA-C Primary Care Provider:  Jarrett Soho, PA-C  CC:  Seizures,stable  08/02/2019: Stable seizures. She gets up at 4am every morning and works from [employer] but she feels great, she gets 15k steps a day. She had 2 seizures, she knows it is coming, she lays down and they may go away. This year she had it twice, she had 2 seizures. She is under a lot of stress and these happened then she went to sleep and felt better may be stress and not so much a seizure no generalized tonic clonic movements.  Patient does not drive.  We discussed her job, based on her job description I did not see any indication to modify based on her seizures, although I did warn her to be diligent about not swimming alone, not bathing alone, not driving, basically not doing anything that might harm herself or others should she had another seizure-like event.  HPI:  Susan Hansen is a 59 y.o. female here as requested by Jarrett Soho, PA-C for partial seizures with generalization and cluster headaches on felbamate and topamax. PMHx HTm as well. Per neurology notes, she was on felbamate 600mg  3.5 tabs 4x daily and topamax 25mg  at last appt with neuro 07-04-2017. Dxed at age 34. She does not drive. Last generalized seizure over a year ago. Typically one every 12-15 months. She was seeing Dr. 11-26-1972 for many years and now he is retiring. Her father passed away in June 10, 2020and her best friend 09/2010. First seizure at 27, she has cluster headaches. She is not on the topamax bc the clusters are in remission. Last generalized seizure a year ago. She feels shaky sometimes and thinks it is glucose and eating protein(eggs) in the morning helps.. She has a seizure aura, she starts feeling something in her stomach, she can go to a room at that time, she starts shaking and she "zones out" and pictures [place] and drains her she feels extremely  tired, everything goes into slow motion and then sometimes go away and then generalizes.  She is under stress. She started on neurontin, depakote, dilantin, tegretol,  valium and failed multiple meds until the Felbamate which works for her. Last aura once in the last 8-9 months and did not progress. Last GTC 1.5 years ago. Unclear why she had a breakthrough, she doesn't know how long they last or what happens, when it resolves she feels emotional and guilty. She does not drive. Unclear etiology of seizures but when she was 13 months she fell off a table. She has had extensive testing in the past and eegs and imaging.   Reviewed notes, labs and imaging from outside physicians, which showed:  CT head 09/08/2008 showed No acute intracranial abnormalities including mass lesion or mass effect, hydrocephalus, extra-axial fluid collection, midline shift, hemorrhage, or acute infarction, large ischemic events (personally reviewed images)  I reviewed neurology notes from Dr. 4.  The first note I see is in April 2017.  She has epilepsy in addition to headaches.  Patient at that time had complained of headache and went to ER and had a CT scan.  In addition to longstanding partial epilepsy she has difficulty with recurring headaches, they occur at night, unilateral occurring in the right orbital area starts as a small pain rapidly builds up to an intense almost intolerable steady headache.  No nausea or vomiting or photophobia.  The episodes last 30  minutes.  She does have some rhinorrhea on the same side recently seen in the emergency room where CAT scan was normal at that time.  Butalbital did not help.  She is had several episodes.  Diagnosed with cluster headaches.  No significant change in her seizure pattern with an occasional partial simple seizure.   Last time she was seen was in May 2019.  She was seen for seizure follow-up.  At that time patient was well controlled but seem to think she had rare episodes  that might be an aura.  She is taking both Felbatol and Topamax on a regular basis with no particular problem.  Diagnosed with partial seizure with generalization.  Seizures well controlled.  She has been on this medication for years without progression to a generalized seizure.  Since she had been on the medication for years no blood levels.  She is seen mostly every year.  Review of Systems: Patient complains of symptoms per HPI as well as the following symptoms: seizures,epilepsy . Pertinent negatives and positives per HPI. All others negative    Social History   Socioeconomic History  . Marital status: Married    Spouse name: Not on file  . Number of children: 2  . Years of education: Not on file  . Highest education level: High school graduate  Occupational History  . Not on file  Tobacco Use  . Smoking status: Never Smoker  . Smokeless tobacco: Never Used  Vaping Use  . Vaping Use: Never used  Substance and Sexual Activity  . Alcohol use: Not Currently    Comment: none in a few years (as of 08/02/19)  . Drug use: Never  . Sexual activity: Not on file  Other Topics Concern  . Not on file  Social History Narrative   Lives at home with her husband   Right handed   Social Determinants of Health   Financial Resource Strain:   . Difficulty of Paying Living Expenses:   Food Insecurity:   . Worried About Programme researcher, broadcasting/film/video in the Last Year:   . Barista in the Last Year:   Transportation Needs:   . Freight forwarder (Medical):   Marland Kitchen Lack of Transportation (Non-Medical):   Physical Activity:   . Days of Exercise per Week:   . Minutes of Exercise per Session:   Stress:   . Feeling of Stress :   Social Connections:   . Frequency of Communication with Friends and Family:   . Frequency of Social Gatherings with Friends and Family:   . Attends Religious Services:   . Active Member of Clubs or Organizations:   . Attends Banker Meetings:   Marland Kitchen Marital  Status:   Intimate Partner Violence:   . Fear of Current or Ex-Partner:   . Emotionally Abused:   Marland Kitchen Physically Abused:   . Sexually Abused:     Family History  Problem Relation Age of Onset  . Stroke Mother   . Heart disease Mother   . Diabetes Father   . Hypertension Father   . Heart disease Father   . Breast cancer Paternal Grandmother   . Seizures Neg Hx     Past Medical History:  Diagnosis Date  . Hyperlipidemia   . Hypertension   . Seizures [facility])     Patient Active Problem List   Diagnosis Date Noted  . Partial epilepsy with impairment of consciousness, with intractable epilepsy (HCC) 07/14/2018  . Episodic cluster  headache 07/14/2018    Past Surgical History:  Procedure Laterality Date  . CESAREAN SECTION      Current Outpatient Medications  Medication Sig Dispense Refill  . Ascorbic Acid (VITAMIN C PO) Take by mouth.    Marland Kitchen. b complex vitamins tablet Take 1 tablet by mouth daily.    . Chlorpheniramine Maleate (ALLERGY RELIEF PO) Take 1 tablet by mouth daily. Allertec from [employer]    . Coenzyme Q10 (COQ10 PO) Take by mouth.    . felbamate (FELBATOL) 600 MG tablet Take 3 tablets by mouth in the morning, 2-3 tablets at lunch, and 3 tablets at bedtime. 810 tablet 3  . Flaxseed, Linseed, (FLAX SEED OIL PO) Take 1,200 mg by mouth daily.    Marland Kitchen. LECITHIN PO Take by mouth.    Marland Kitchen. lisinopril (ZESTRIL) 20 MG tablet Take 20 mg by mouth daily.     Marland Kitchen. LORazepam (ATIVAN) 1 MG tablet Please take 1 tablet at onset of aura. May take an additional dose in 5-15 minutes. Max 2mg  a day. 30 tablet 4  . MAGNESIUM PO Take by mouth. At least 400 mg daily    . Red Yeast Rice Extract 300 MG CAPS Take 1 tablet by mouth daily.     No current facility-administered medications for this visit.    Allergies as of 08/02/2019  . (No Known Allergies)    Vitals: BP (!) 150/84 (BP Location: Left Arm, Patient Position: Sitting)   Pulse 80   Ht 5\' 8"  (1.727 m)   Wt 167 lb (75.8 kg)   BMI 25.39  kg/m  Last Weight:  Wt Readings from Last 1 Encounters:  08/02/19 167 lb (75.8 kg)   Last Height:   Ht Readings from Last 1 Encounters:  08/02/19 5\' 8"  (1.727 m)   Physical exam: Exam: Gen: NAD, conversant, well nourised, obese, well groomed                     CV: RRR, no MRG. No Carotid Bruits. No peripheral edema, warm, nontender Eyes: Conjunctivae clear without exudates or hemorrhage  Neuro: Detailed Neurologic Exam  Speech:    Speech is normal; fluent and spontaneous with normal comprehension.  Cognition:    The patient is oriented to person, place, and time;     recent and remote memory intact;     language fluent;     normal attention, concentration,     fund of knowledge Cranial Nerves:    The pupils are equal, round, and reactive to light. The fundi are normal and spontaneous venous pulsations are present. Visual fields are full to finger confrontation. Extraocular movements are intact. Trigeminal sensation is intact and the muscles of mastication are normal. The face is symmetric. The palate elevates in the midline. Hearing intact. Voice is normal. Shoulder shrug is normal. The tongue has normal motion without fasciculations.   Coordination:    Normal finger to nose and heel to shin. Normal rapid alternating movements.   Gait:    Heel-toe and tandem gait are normal.   Motor Observation:    No asymmetry, no atrophy, and no involuntary movements noted. Tone:    Normal muscle tone.    Posture:    Posture is normal. normal erect    Strength:    Strength is V/V in the upper and lower limbs.      Sensation: intact to LT        Assessment/Plan:  59 y.o. female here as requested by Jarrett SohoWharton, Courtney,  PA-C for partial seizures with generalization and cluster headaches on felbamate and topamax. PMHx HTm as well. Per neurology notes, she was on felbamate 600mg  3.5 tabs 4x daily and topamax 25mg  at last appt with neuro 05-2017. Dxed at age 13. She does not  drive.  Episodic Cluster HA: Used Topiramate and did well. Not on it now as the cluster headaches are in remission. If they occur, start high dose steroids (start at 100mg  daily and taper over 2 weeks), Topamax or Emgality. She will call. She has not had a cluster headache since last being seen.  Partial seizures with generaliztion: Continue Felbamate. Ativan for aura. Per prior neuro notes, she has been prescribed Felbamate 600mg  3.5 tabs 4x a day(#240). This is an excessive does but considering she has been stable on this dose for years will continue. Discussed risks, she understands this is a very high dose and acknowledges risks. I called [facility] on [address] to confirm and the Felbamate was not filled there. I will email patient and ask her where she fills the Felbamate and call and verify dose and pills dispenced before prescribing.   Meds ordered this encounter  Medications  . LORazepam (ATIVAN) 1 MG tablet    Sig: Please take 1 tablet at onset of aura. May take an additional dose in 5-15 minutes. Max 2mg  a day.    Dispense:  30 tablet    Refill:  4  . felbamate (FELBATOL) 600 MG tablet    Sig: Take 3 tablets by mouth in the morning, 2-3 tablets at lunch, and 3 tablets at bedtime.    Dispense:  810 tablet    Refill:  3   Per [facility] statutes, patients with seizures are not allowed to drive until they have been seizure-free for six months.    Use caution when using heavy equipment or power tools. Avoid working on ladders or at heights. Take showers instead of baths. Ensure the water temperature is not too high on the home water heater. Do not go swimming alone. Do not lock yourself in a room alone (i.e. bathroom). When caring for infants or small children, sit down when holding, feeding, or changing them to minimize risk of injury to the child in the event you have a seizure. Maintain good sleep hygiene. Avoid alcohol.    If patient has another seizure, call 911 and  bring them back to the ED if: A.  The seizure lasts longer than 5 minutes.      B.  The patient doesn't wake shortly after the seizure or has new problems such as difficulty seeing, speaking or moving following the seizure C.  The patient was injured during the seizure D.  The patient has a temperature over 102 F (39C) E.  The patient vomited during the seizure and now is having trouble breathing   Cc: , PA-C,    , MD  [facility] [address] [place], Jarrett Soho Naomie Dean  Phone [phone] Fax [phone]  I spent 15 minutes of face-to-face and non-face-to-face time with patient on the  1. Partial epilepsy with impairment of consciousness, with intractable epilepsy (HCC)    diagnosis.  This included previsit chart review, lab review, study review, order entry, electronic health record documentation, patient education on the different diagnostic and therapeutic options, counseling and coordination of care, risks and benefits of management, compliance, or risk factor reduction

## 2019-08-02 ENCOUNTER — Telehealth: Payer: Self-pay | Admitting: Neurology

## 2019-08-02 ENCOUNTER — Ambulatory Visit (INDEPENDENT_AMBULATORY_CARE_PROVIDER_SITE_OTHER): Payer: 59 | Admitting: Neurology

## 2019-08-02 ENCOUNTER — Other Ambulatory Visit: Payer: Self-pay

## 2019-08-02 ENCOUNTER — Encounter: Payer: Self-pay | Admitting: Neurology

## 2019-08-02 VITALS — BP 150/84 | HR 80 | Ht 68.0 in | Wt 167.0 lb

## 2019-08-02 DIAGNOSIS — G40219 Localization-related (focal) (partial) symptomatic epilepsy and epileptic syndromes with complex partial seizures, intractable, without status epilepticus: Secondary | ICD-10-CM | POA: Diagnosis not present

## 2019-08-02 MED ORDER — LORAZEPAM 1 MG PO TABS: ORAL_TABLET | ORAL | 4 refills | Status: DC

## 2019-08-02 MED ORDER — FELBAMATE 600 MG PO TABS: ORAL_TABLET | ORAL | 3 refills | Status: DC

## 2019-08-02 NOTE — Telephone Encounter (Signed)
Tori: Do you mind calling patient and scheduling her for a 1 year follow up please? 1130 or last appointment of the day please so Fredia Beets a little extra time with her unless that is a problem then anytime is fine.

## 2019-08-02 NOTE — Telephone Encounter (Signed)
I let Susan Hansen know the pt was already scheduled for 1 yr f/u 07/31/20 at 1:30 pm for 1 hour.

## 2019-08-06 ENCOUNTER — Telehealth: Payer: Self-pay | Admitting: *Deleted

## 2019-08-06 DIAGNOSIS — Z0289 Encounter for other administrative examinations: Secondary | ICD-10-CM

## 2019-08-06 NOTE — Telephone Encounter (Signed)
Completed work accomodation form with Dr. Lucia Gaskins and it was signed by MD. Rhina Brackett to medical records for processing.

## 2020-04-22 DIAGNOSIS — R1012 Left upper quadrant pain: Secondary | ICD-10-CM | POA: Diagnosis not present

## 2020-04-22 DIAGNOSIS — E7800 Pure hypercholesterolemia, unspecified: Secondary | ICD-10-CM | POA: Diagnosis not present

## 2020-04-22 DIAGNOSIS — I10 Essential (primary) hypertension: Secondary | ICD-10-CM | POA: Diagnosis not present

## 2020-05-13 DIAGNOSIS — E7800 Pure hypercholesterolemia, unspecified: Secondary | ICD-10-CM | POA: Diagnosis not present

## 2020-05-13 DIAGNOSIS — I10 Essential (primary) hypertension: Secondary | ICD-10-CM | POA: Diagnosis not present

## 2020-07-17 DIAGNOSIS — S52502A Unspecified fracture of the lower end of left radius, initial encounter for closed fracture: Secondary | ICD-10-CM | POA: Diagnosis not present

## 2020-07-17 DIAGNOSIS — M25532 Pain in left wrist: Secondary | ICD-10-CM | POA: Diagnosis not present

## 2020-07-21 DIAGNOSIS — S52502A Unspecified fracture of the lower end of left radius, initial encounter for closed fracture: Secondary | ICD-10-CM | POA: Diagnosis not present

## 2020-07-21 DIAGNOSIS — M25532 Pain in left wrist: Secondary | ICD-10-CM | POA: Diagnosis not present

## 2020-07-31 ENCOUNTER — Encounter: Payer: Self-pay | Admitting: Neurology

## 2020-07-31 ENCOUNTER — Ambulatory Visit (INDEPENDENT_AMBULATORY_CARE_PROVIDER_SITE_OTHER): Payer: BC Managed Care – PPO | Admitting: Neurology

## 2020-07-31 VITALS — BP 130/74 | HR 65 | Ht 66.0 in | Wt 162.0 lb

## 2020-07-31 DIAGNOSIS — G44019 Episodic cluster headache, not intractable: Secondary | ICD-10-CM

## 2020-07-31 DIAGNOSIS — Z79899 Other long term (current) drug therapy: Secondary | ICD-10-CM | POA: Diagnosis not present

## 2020-07-31 DIAGNOSIS — G40219 Localization-related (focal) (partial) symptomatic epilepsy and epileptic syndromes with complex partial seizures, intractable, without status epilepticus: Secondary | ICD-10-CM | POA: Diagnosis not present

## 2020-07-31 NOTE — Patient Instructions (Signed)
Felbamate tablets What is this medication? FELBAMATE (fel BAM ate) is used to control seizures in certain types of epilepsy. This medicine is only for people who have not responded to othertherapy. This medicine may be used for other purposes; ask your health care provider orpharmacist if you have questions. COMMON BRAND NAME(S): Felbatol What should I tell my care team before I take this medication? They need to know if you have any of these conditions: blood disorders or disease liver disease suicidal thoughts, plans, or attempt; a previous suicide attempt by you or a family member an unusual or allergic reaction to felbamate, carbamates, other medicines, foods, dyes, or preservatives pregnant or trying to get pregnant breast-feeding How should I use this medication? Take this medicine by mouth with a glass of water. Follow the directions on the prescription label. This medicine may be taken with or without food. Take your doses at regular intervals. Do not take your medicine more often than directed. Do not stop taking this medicine suddenly. This increases the risk of seizures. Your doctor will tell you how much medicine to take. If your doctor wants you to stop the medicine, the dose will be slowly lowered over time to avoid anyside effects. Talk to your pediatrician regarding the use of this medicine in children. While this medicine may be prescribed for children for selected conditions,precautions do apply. Overdosage: If you think you have taken too much of this medicine contact apoison control center or emergency room at once. NOTE: This medicine is only for you. Do not share this medicine with others. What if I miss a dose? If you miss a dose, take it as soon as you can. If it is almost time for yournext dose, take only that dose. Do not take double or extra doses. What may interact with this medication? carbamazepine female hormones, including contraceptive or birth control  pills phenobarbital phenytoin sevelamer valproic acid This list may not describe all possible interactions. Give your health care provider a list of all the medicines, herbs, non-prescription drugs, or dietary supplements you use. Also tell them if you smoke, drink alcohol, or use illegaldrugs. Some items may interact with your medicine. What should I watch for while using this medication? Visit your doctor or health care professional for regular checks on yourprogress. Wear a medical ID bracelet or chain, and carry a card that describes yourdisease and details of your medicine and dosage times. You may get drowsy or dizzy. Do not drive, use machinery, or do anything that needs mental alertness until you know how this medicine affects you. Do not stand or sit up quickly, especially if you are an older patient. This reduces the risk of dizzy or fainting spells. Alcohol may interfere with the effect ofthis medicine. Avoid alcoholic drinks. This medicine can make you more sensitive to the sun. Keep out of the sun. If you cannot avoid being in the sun, wear protective clothing and use sunscreen.Do not use sun lamps or tanning beds/booths. The use of this medicine may increase the chance of suicidal thoughts or actions. Pay special attention to how you are responding while on this medicine. Any worsening of mood, or thoughts of suicide or dying should bereported to your health care professional right away. What side effects may I notice from receiving this medication? Side effects that you should report to your doctor or health care professionalas soon as possible: allergic reactions like skin rash, itching or hives, swelling of the face, lips, or tongue blood  in urine breathing problems confusion fever, chills, sore throat unusual bleeding or bruising, pinpoint red spots on skin unusually weak or tired weight loss worsening of mood, thoughts or actions of suicide or dying yellowing of the eyes or  skin Side effects that usually do not require medical attention (report to yourdoctor or health care professional if they continue or are bothersome): changes in taste changes in vision diarrhea or constipation difficulty sleeping headache nausea/vomiting tremors This list may not describe all possible side effects. Call your doctor for medical advice about side effects. You may report side effects to FDA at1-800-FDA-1088. Where should I keep my medication? Keep out of reach of children. Store at room temperature between 20 and 25 degrees C (68 and 77 degrees F). Keep container tightly closed. Throw away any unused medicine after theexpiration date. NOTE: This sheet is a summary. It may not cover all possible information. If you have questions about this medicine, talk to your doctor, pharmacist, orhealth care provider.  2022 Elsevier/Gold Standard (2007-05-22 15:50:52)

## 2020-07-31 NOTE — Progress Notes (Signed)
[name]    Provider:  Dr Lucia GaskinsAhern Requesting Provider: Jarrett SohoWharton, Courtney, PA-C Primary Care Provider:  Jarrett SohoWharton, Courtney, PA-C  CC:  [facility]  July 31, 2020: This is a hilarious very nice 60 year old patient who has a past medical history of partial epilepsy on felbamate.  Patient is tried multiple epilepsy medications in the past, felbamate is the one that has worked the best and had the least side effects.  Unfortunately it is an expensive medication, today I looked into getting her some patient assistance or possibly getting samples and we will continue to work on that.  Patient has a history of cluster headaches, my notes indicate that in the past topiramate had worked for her but today she denied that so I am not sure if topiramate worked in the past for her cluster headaches or not.  Likely she has been in remission, it usually about this time that they happen but she has not had a cluster.  I discussed if she does to give us a call and we will start high-dose steroids and probably Emgality.  Other than falling and fracturing her left distal radius, she feels she is doing well, no seizures.  08/02/2019: Stable seizures. She gets up at 4am every morning and works from 6am-3pm but she feels great, she gets 15k steps a day. She had 2 seizures, she knows it is coming, she lays down and they may go away. This year she had it twice, she had 2 seizures. She is under a lot of stress and these happened then she went to sleep and felt better may be stress and not so much a seizure no generalized tonic clonic movements.  Patient does not drive.  We discussed her job, based on her job description I did not see any indication to modify based on her seizures, although I did warn her to be diligent about not swimming alone, not bathing alone, not driving, basically not doing anything that might harm herself or others should she had another seizure-like event. She has not had cluster  headaches,   HPI:  Susan Hansen is a 60 y.o. female here as requested by Jarrett SohoWharton, Courtney, PA-C for partial seizures with generalization and cluster headaches on felbamate and topamax. PMHx HTm as well. Per neurology notes, she was on felbamate 600mg  3.5 tabs 4x daily and topamax 25mg  at last appt with neuro 05-2017. Dxed at age 60. She does not drive. Last generalized seizure over a year ago. Typically one every 12-15 months. She was seeing Dr. Windle GuardHaworth for many years and now he is retiring. Her father passed away in May 2020 and her best friend 09/2010. First seizure at [age], she has cluster headaches. She is not on the topamax bc the clusters are in remission. Last generalized seizure a year ago. She feels shaky sometimes and thinks it is glucose and eating protein(eggs) in the morning helps.. She has a seizure aura, she starts feeling something in her stomach, she can go to a room at that time, she starts shaking and she "zones out" and pictures [place] and drains her she feels extremely tired, everything goes into slow motion and then sometimes go away and then generalizes.  She is under stress. She started on neurontin, depakote, dilantin, tegretol,  valium and failed multiple meds until the Felbamate which works for her. Last aura once in the last 8-9 months and did not progress. Last GTC 1.5 years ago. Unclear why she had a breakthrough, she doesn't  know how long they last or what happens, when it resolves she feels emotional and guilty. She does not drive. Unclear etiology of seizures but when she was 13 months she fell off a table. She has had extensive testing in the past and eegs and imaging.   Reviewed notes, labs and imaging from outside physicians, which showed:  CT head 09/08/2008 showed No acute intracranial abnormalities including mass lesion or mass effect, hydrocephalus, extra-axial fluid collection, midline shift, hemorrhage, or acute infarction, large ischemic events (personally reviewed  images)  I reviewed neurology notes from Dr. Windle Guard.  The first note I see is in April 2017.  She has epilepsy in addition to headaches.  Patient at that time had complained of headache and went to ER and had a CT scan.  In addition to longstanding partial epilepsy she has difficulty with recurring headaches, they occur at night, unilateral occurring in the right orbital area starts as a small pain rapidly builds up to an intense almost intolerable steady headache.  No nausea or vomiting or photophobia.  The episodes last 30 minutes.  She does have some rhinorrhea on the same side recently seen in the emergency room where CAT scan was normal at that time.  Butalbital did not help.  She is had several episodes.  Diagnosed with cluster headaches.  No significant change in her seizure pattern with an occasional partial simple seizure.   Last time she was seen was in May 2019.  She was seen for seizure follow-up.  At that time patient was well controlled but seem to think she had rare episodes that might be an aura.  She is taking both Felbatol and Topamax on a regular basis with no particular problem.  Diagnosed with partial seizure with generalization.  Seizures well controlled.  She has been on this medication for years without progression to a generalized seizure.  Since she had been on the medication for years no blood levels.  She is seen mostly every year.  Review of Systems: Patient complains of symptoms per HPI as well as the following symptoms: radial fracture . Pertinent negatives and positives per HPI. All others negative     Social History   Socioeconomic History   Marital status: Married    Spouse name: Not on file   Number of children: 2   Years of education: Not on file   Highest education level: High school graduate  Occupational History   Not on file  Tobacco Use   Smoking status: Never   Smokeless tobacco: Never  Vaping Use   Vaping Use: Never used  Substance and Sexual  Activity   Alcohol use: Not Currently    Comment: none in a few years (as of 08/02/19)   Drug use: Never   Sexual activity: Not on file  Other Topics Concern   Not on file  Social History Narrative   Lives at home with her husband   Right handed   Social Determinants of Health   Financial Resource Strain: Not on file  Food Insecurity: Not on file  Transportation Needs: Not on file  Physical Activity: Not on file  Stress: Not on file  Social Connections: Not on file  Intimate Partner Violence: Not on file    Family History  Problem Relation Age of Onset   Stroke Mother    Heart disease Mother    Diabetes Father    Hypertension Father    Heart disease Father    Breast cancer Paternal Grandmother  Seizures Neg Hx     Past Medical History:  Diagnosis Date   Hyperlipidemia    Hypertension    Seizures (HCC)     Patient Active Problem List   Diagnosis Date Noted   Partial epilepsy with impairment of consciousness, with intractable epilepsy (HCC) 07/14/2018   Episodic cluster headache 07/14/2018    Past Surgical History:  Procedure Laterality Date   CESAREAN SECTION      Current Outpatient Medications  Medication Sig Dispense Refill   Ascorbic Acid (VITAMIN C PO) Take by mouth.     b complex vitamins tablet Take 1 tablet by mouth daily.     Chlorpheniramine Maleate (ALLERGY RELIEF PO) Take 1 tablet by mouth daily. Allertec from [employer]     Coenzyme Q10 (COQ10 PO) Take by mouth.     felbamate (FELBATOL) 600 MG tablet Take 3 tablets by mouth in the morning, 2-3 tablets at lunch, and 3 tablets at bedtime. 810 tablet 3   Flaxseed, Linseed, (FLAX SEED OIL PO) Take 1,200 mg by mouth daily.     LECITHIN PO Take by mouth.     lisinopril (ZESTRIL) 20 MG tablet Take 20 mg by mouth daily.      LORazepam (ATIVAN) 1 MG tablet Please take 1 tablet at onset of aura. May take an additional dose in 5-15 minutes. Max 2mg  a day. 30 tablet 4   MAGNESIUM PO Take by mouth. At least 400  mg daily     Red Yeast Rice Extract 300 MG CAPS Take 1 tablet by mouth daily.     No current facility-administered medications for this visit.    Allergies as of 07/31/2020   (No Known Allergies)    Vitals: BP 130/74   Pulse 65   Ht 5\' 6"  (1.676 m)   Wt 162 lb (73.5 kg)   BMI 26.15 kg/m  Last Weight:  Wt Readings from Last 1 Encounters:  07/31/20 162 lb (73.5 kg)   Last Height:   Ht Readings from Last 1 Encounters:  07/31/20 5\' 6"  (1.676 m)  Exam: NAD, pleasant                  Speech:    Speech is normal; fluent and spontaneous with normal comprehension.  Cognition:    The patient is oriented to person, place, and time;     recent and remote memory intact;     language fluent;    Cranial Nerves:    The pupils are equal, round, and reactive to light.Trigeminal sensation is intact and the muscles of mastication are normal. The face is symmetric. The palate elevates in the midline. Hearing intact. Voice is normal. Shoulder shrug is normal. The tongue has normal motion without fasciculations.   Coordination:  No dysmetria  Motor Observation:    No asymmetry, no atrophy, and no involuntary movements noted. Tone:    Normal muscle tone.     Strength:    Strength is V/V in the upper and lower limbs. Cast on left forearm.     Sensation: intact to LT         Assessment/Plan:  60 y.o. female here as requested by 10/01/20, PA-C for partial seizures with generalization and cluster headaches on felbamate(tried and failed multiple others). Per neurology notes, she was on felbamate 600mg  3.5 tabs 4x daily and topamax 25mg  at last appt with neuro 05-2017. Dxed at age 83. She does not drive.  Felbamate very expensive, let's see if we can get her  some samples or company assistance  call with results do not mychart  Episodic Cluster HA: Used Topiramate and did well (she denies she used Topamax in the past today). Not on it now as the cluster headaches are still in  remission. If they occur, start high dose steroids (start at 100mg  daily and taper over 2 weeks), Topamax or Emgality. She will call. She has not had a cluster headache since last being seen.  Partial seizures with generaliztion: Continue Felbamate. Ativan for aura. Per prior neuro notes, she has been prescribed Felbamate 600mg  3.5 tabs 4x a day(#240). This is an excessive dose but considering she has been stable on this dose for years will continue. Discussed risks, she understands this is a very high dose and acknowledges risks.    Cc: , PA-C,    , MD  [facility] [address] [place], [address] [place]  Phone [phone] Fax [phone]  I spent 30 minutes of face-to-face and non-face-to-face time with patient on the  1. Partial epilepsy with impairment of consciousness, with intractable epilepsy (HCC)   2. Long-term use of high-risk medication   3. Episodic cluster headache, not intractable    diagnosis.  This included previsit chart review, lab review, study review, order entry, electronic health record documentation, patient education on the different diagnostic and therapeutic options, counseling and coordination of care, risks and benefits of management, compliance, or risk factor reduction

## 2020-08-04 ENCOUNTER — Telehealth: Payer: Self-pay | Admitting: *Deleted

## 2020-08-04 NOTE — Telephone Encounter (Signed)
-----   Message from Anson Fret, MD sent at 08/04/2020  3:25 PM EDT ----- Still awaiting Felbatol level but the other labs were normal thanks

## 2020-08-06 ENCOUNTER — Telehealth: Payer: Self-pay | Admitting: *Deleted

## 2020-08-06 LAB — CBC WITH DIFFERENTIAL/PLATELET
Basophils Absolute: 0 10*3/uL (ref 0.0–0.2)
Basos: 1 %
EOS (ABSOLUTE): 0.2 10*3/uL (ref 0.0–0.4)
Eos: 5 %
Hematocrit: 36.7 % (ref 34.0–46.6)
Hemoglobin: 12.6 g/dL (ref 11.1–15.9)
Immature Grans (Abs): 0 10*3/uL (ref 0.0–0.1)
Immature Granulocytes: 0 %
Lymphocytes Absolute: 1.1 10*3/uL (ref 0.7–3.1)
Lymphs: 26 %
MCH: 30.4 pg (ref 26.6–33.0)
MCHC: 34.3 g/dL (ref 31.5–35.7)
MCV: 89 fL (ref 79–97)
Monocytes Absolute: 0.3 10*3/uL (ref 0.1–0.9)
Monocytes: 7 %
Neutrophils Absolute: 2.7 10*3/uL (ref 1.4–7.0)
Neutrophils: 61 %
Platelets: 193 10*3/uL (ref 150–450)
RBC: 4.14 x10E6/uL (ref 3.77–5.28)
RDW: 11.5 % — ABNORMAL LOW (ref 11.7–15.4)
WBC: 4.3 10*3/uL (ref 3.4–10.8)

## 2020-08-06 LAB — TSH: TSH: 0.851 u[IU]/mL (ref 0.450–4.500)

## 2020-08-06 LAB — COMPREHENSIVE METABOLIC PANEL
ALT: 23 IU/L (ref 0–32)
AST: 19 IU/L (ref 0–40)
Albumin/Globulin Ratio: 1.9 (ref 1.2–2.2)
Albumin: 4.5 g/dL (ref 3.8–4.8)
Alkaline Phosphatase: 97 IU/L (ref 44–121)
BUN/Creatinine Ratio: 24 (ref 12–28)
BUN: 17 mg/dL (ref 8–27)
Bilirubin Total: 0.2 mg/dL (ref 0.0–1.2)
CO2: 25 mmol/L (ref 20–29)
Calcium: 9.3 mg/dL (ref 8.7–10.3)
Chloride: 97 mmol/L (ref 96–106)
Creatinine, Ser: 0.7 mg/dL (ref 0.57–1.00)
Globulin, Total: 2.4 g/dL (ref 1.5–4.5)
Glucose: 90 mg/dL (ref 65–99)
Potassium: 4.3 mmol/L (ref 3.5–5.2)
Sodium: 136 mmol/L (ref 134–144)
Total Protein: 6.9 g/dL (ref 6.0–8.5)
eGFR: 98 mL/min/{1.73_m2} (ref >59)

## 2020-08-06 LAB — FELBAMATE LEVEL: Felbamate Lvl: 82 ug/mL (ref 40–100)

## 2020-08-06 NOTE — Telephone Encounter (Signed)
-----   Message from Anson Fret, MD sent at 08/06/2020 10:38 AM EDT ----- Felbamate level is normal, looks good. Please call her she does not want to me mycharted.

## 2020-08-06 NOTE — Telephone Encounter (Signed)
I called pt and LMVM for her that her felbamate level normal.  She is to call back if questions.

## 2020-08-13 DIAGNOSIS — M25532 Pain in left wrist: Secondary | ICD-10-CM | POA: Diagnosis not present

## 2020-08-13 DIAGNOSIS — S52502A Unspecified fracture of the lower end of left radius, initial encounter for closed fracture: Secondary | ICD-10-CM | POA: Diagnosis not present

## 2020-09-03 DIAGNOSIS — S52502D Unspecified fracture of the lower end of left radius, subsequent encounter for closed fracture with routine healing: Secondary | ICD-10-CM | POA: Diagnosis not present

## 2020-09-06 IMAGING — MG MM BREAST LOCALIZATION CLIP
4 series · 4 of 12 positions shown · non-contrast
Comparison: Previous exam(s).

CLINICAL DATA: Post ultrasound-guided biopsy of a mass in the left
breast at the 2 o'clock position.

EXAM:
DIAGNOSTIC LEFT MAMMOGRAM POST ULTRASOUND BIOPSY

[L ML synth-2D]
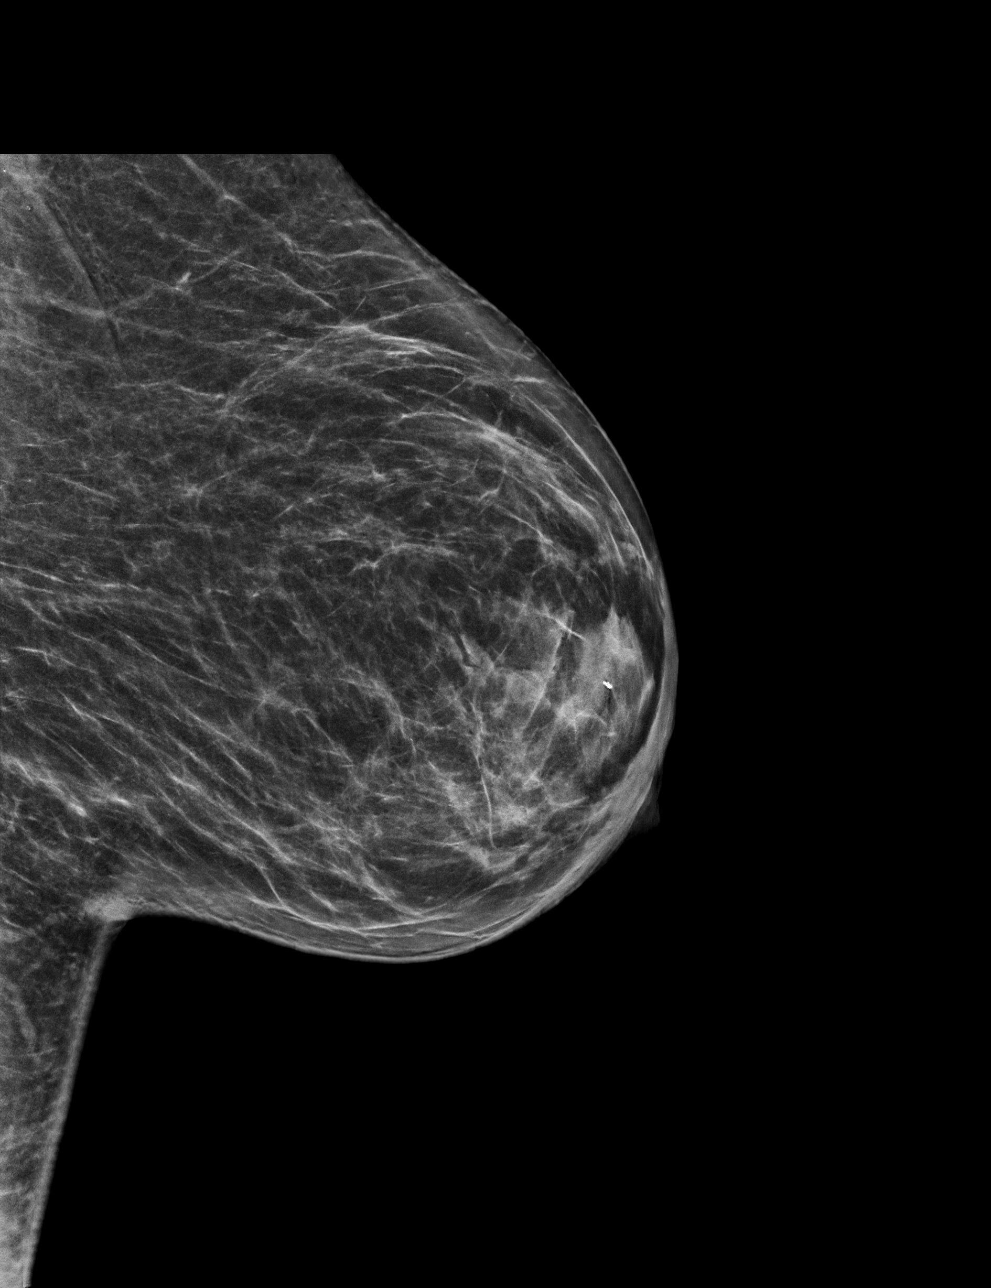

[L CC synth-2D]
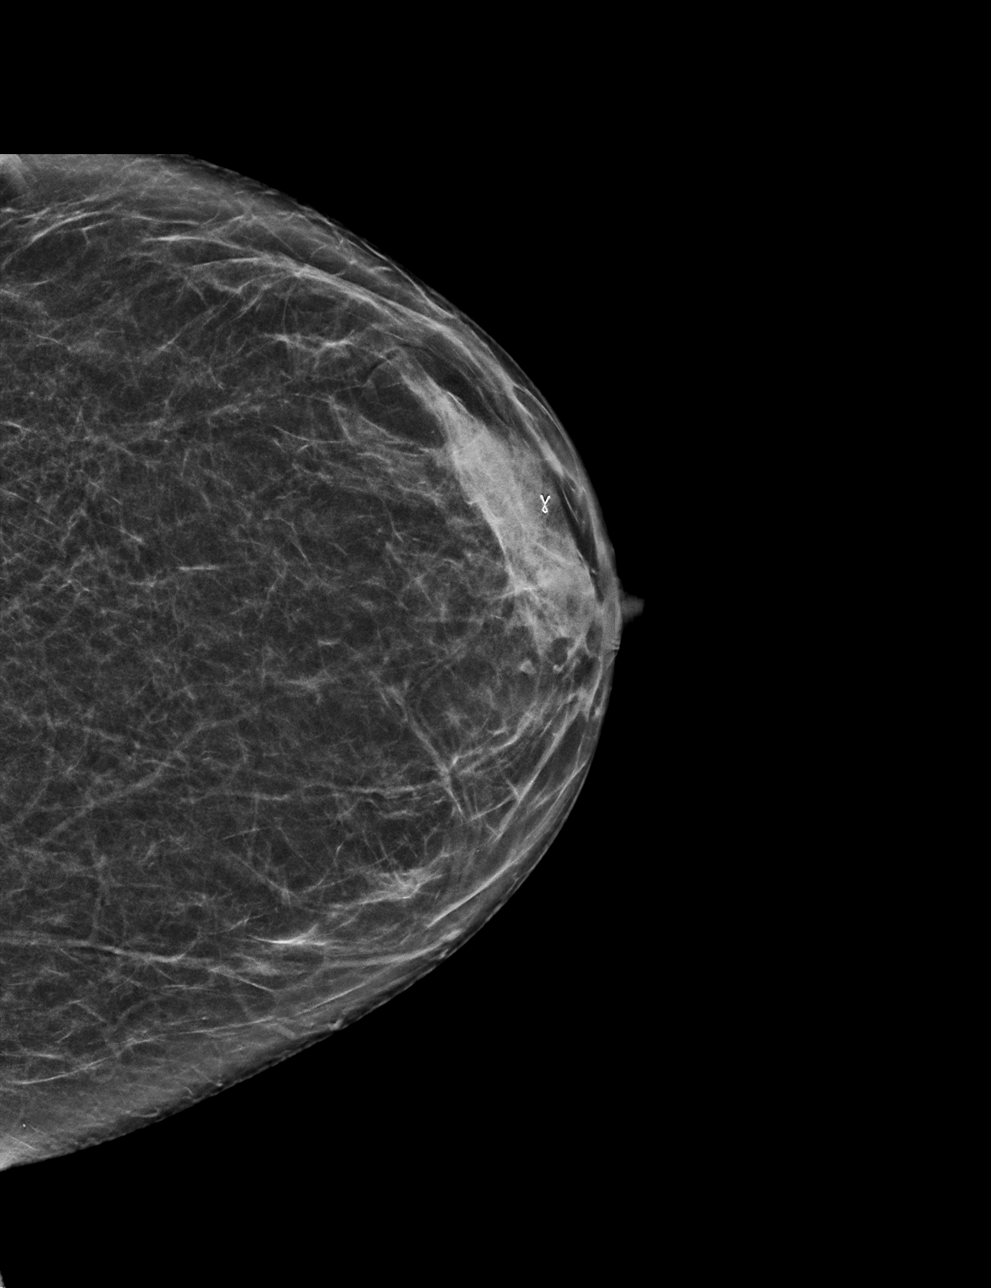

[L CC tomo · tomo slice 29/57.0]
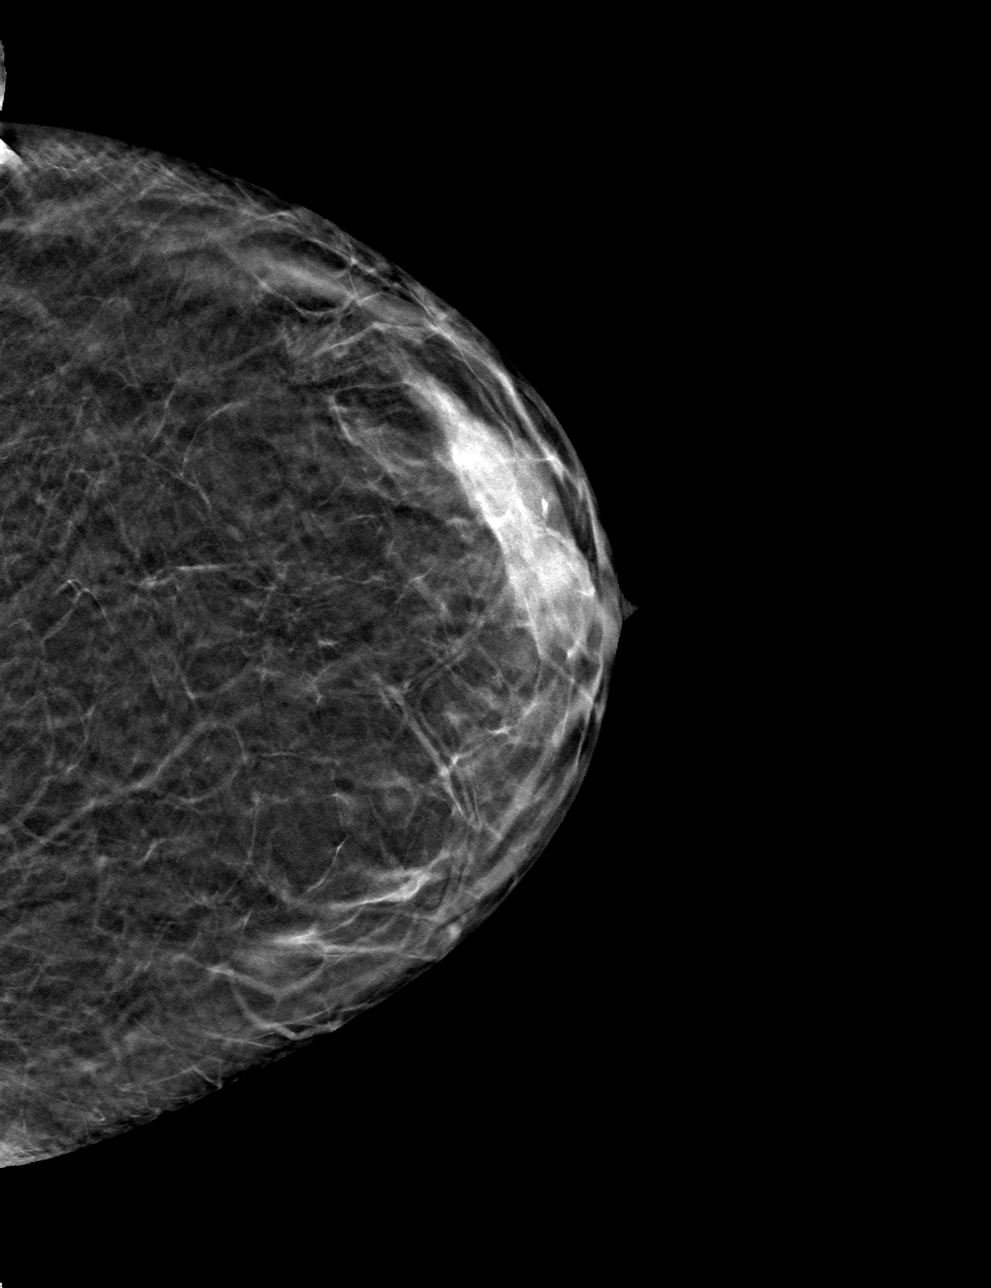

[L ML tomo · tomo slice 33/65.0]
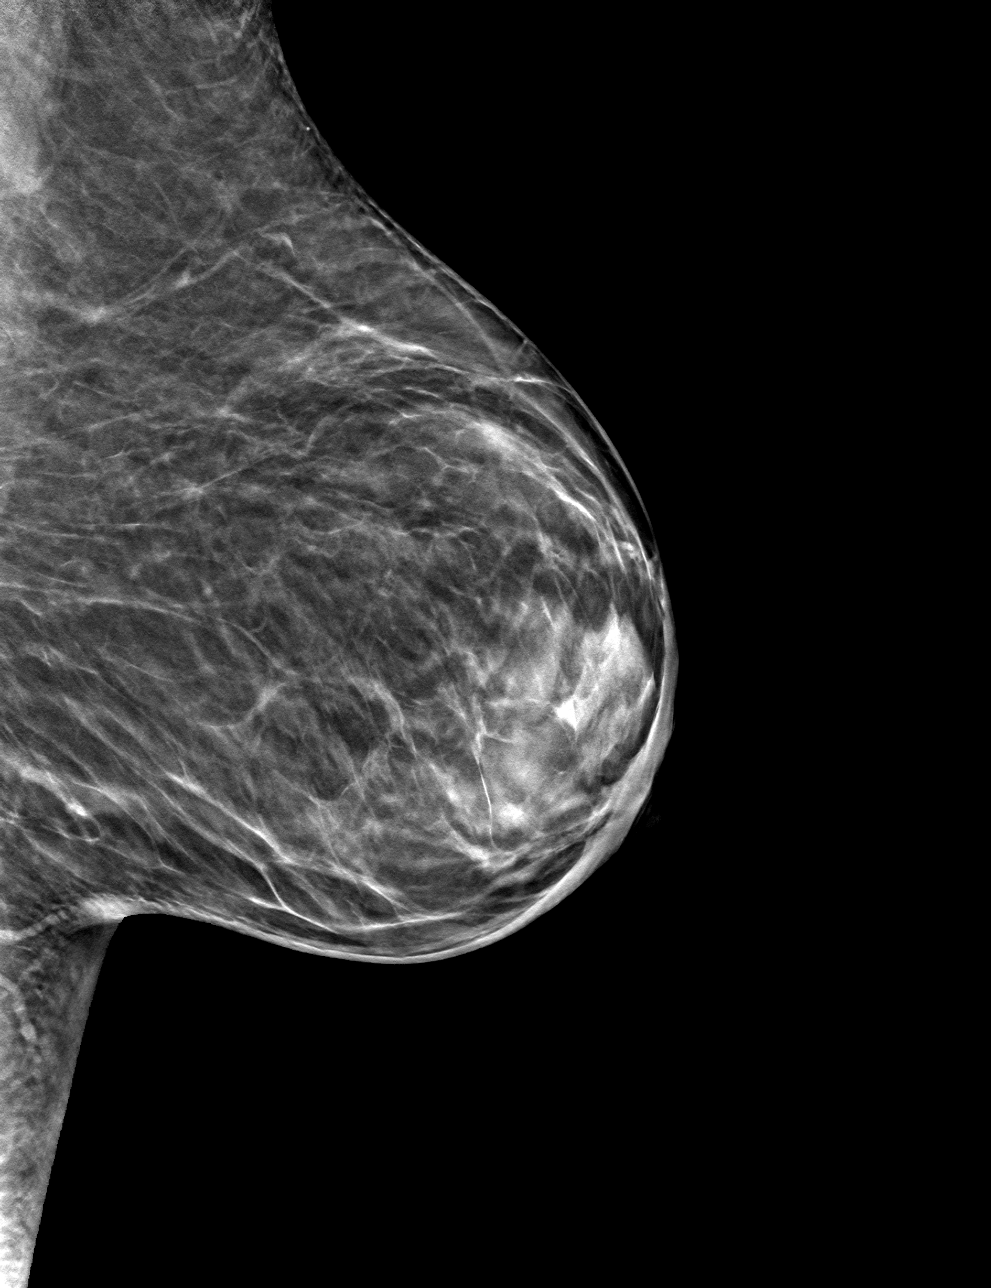

[4 of 12 positions shown; findings below may reference images not displayed]

FINDINGS: Mammographic images were obtained following ultrasound guided biopsy
of a mass in the left breast at the 2 o'clock position. A ribbon
shaped biopsy marking clip is present at the site of the biopsied
mass in the left breast the 2 o'clock position.
IMPRESSION: Ribbon shaped biopsy marking clip positioned at site of biopsied
mass in the left breast at the 2 o'clock position.

Final Assessment: Post Procedure Mammograms for Marker Placement

## 2020-09-06 IMAGING — US US BREAST BX W LOC DEV 1ST LESION IMG BX SPEC US GUIDE*L*
1 series · 9 of 9 positions shown · non-contrast
Comparison: Previous exam(s).
COMPARISON: Previous exam(s).

Addendum:
CLINICAL DATA: 58-year-old female with an indeterminate mass in the
left breast at the 2 o'clock position.

EXAM:
ULTRASOUND GUIDED LEFT BREAST CORE NEEDLE BIOPSY

[Series 1: us breast bx w loc dev 1st lesion img bx spec us g · 0.06mm/px · 9 of 9 slices shown]
[im 1/9]
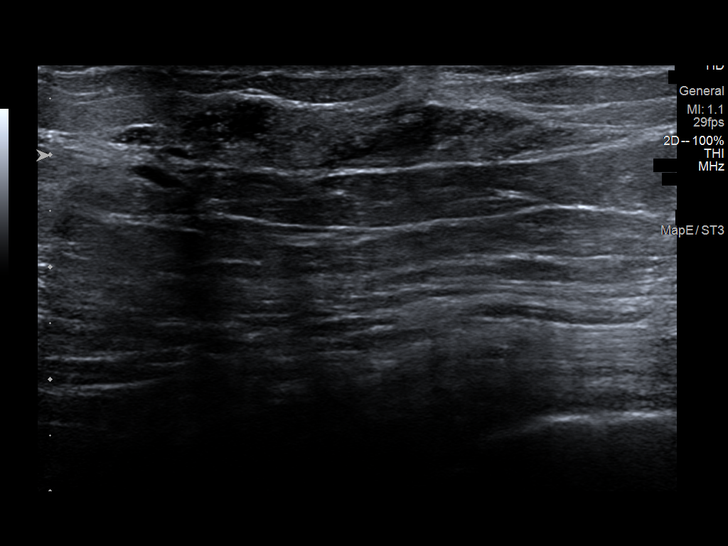
[im 2/9]
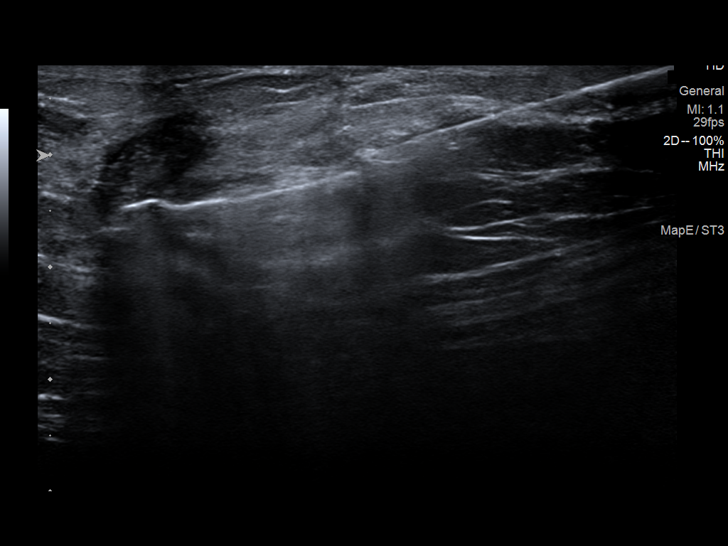
[im 3/9]
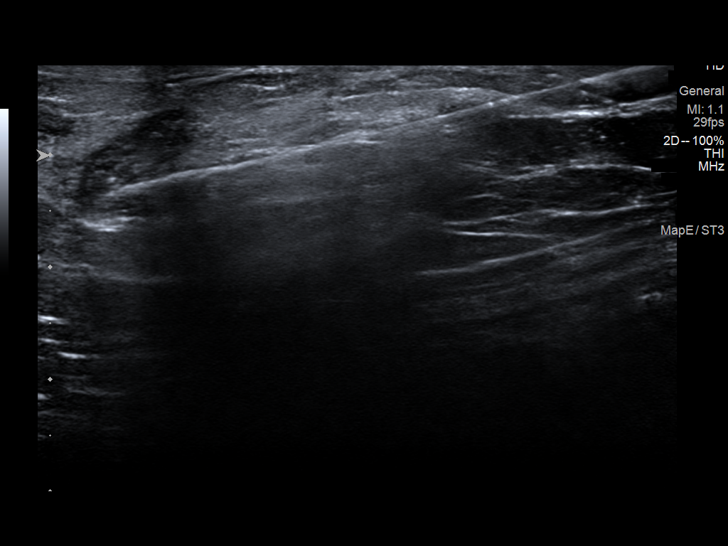
[im 4/9]
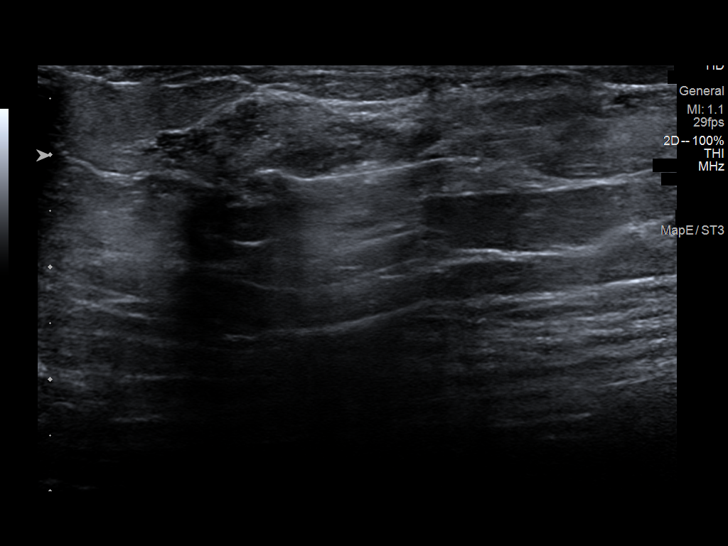
[im 5/9]
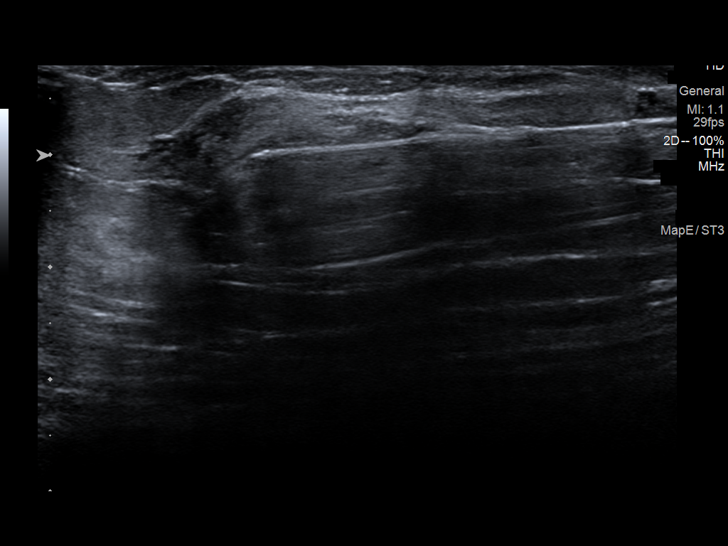
[im 6/9]
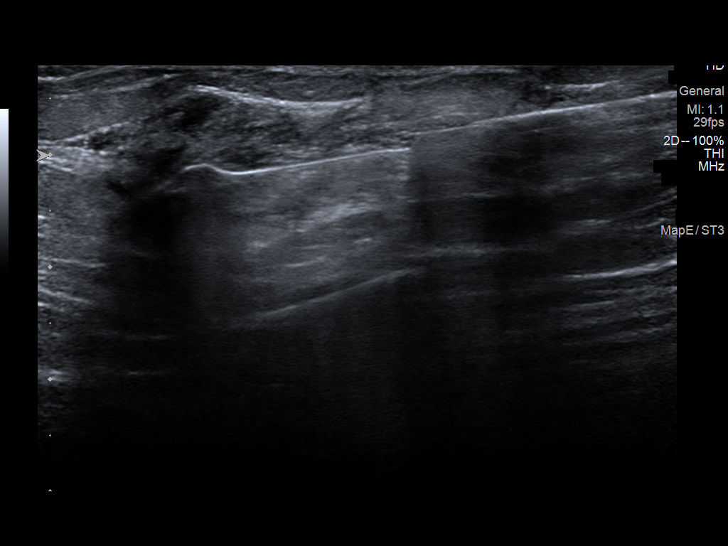
[im 7/9]
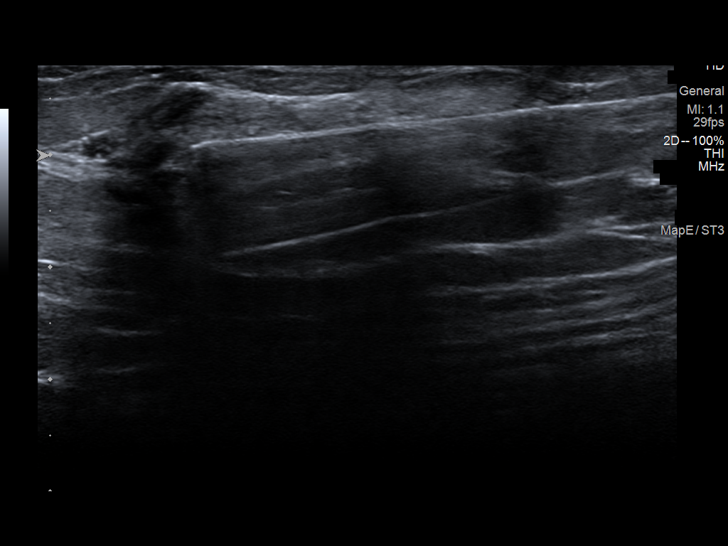
[im 8/9]
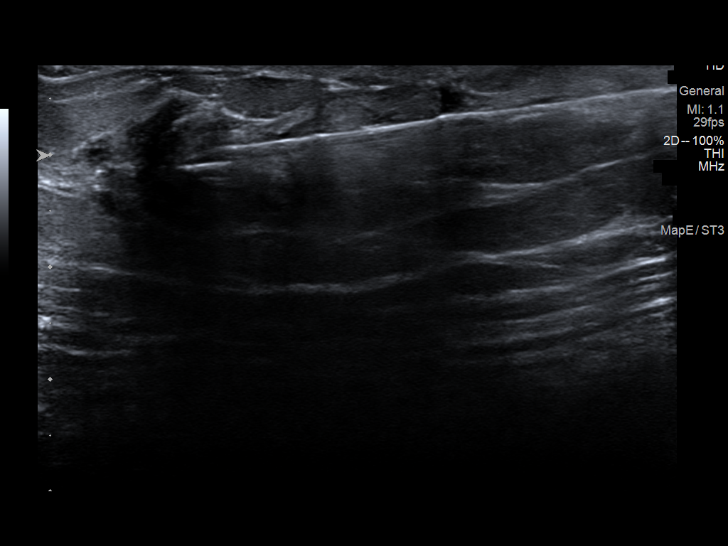
[im 9/9]
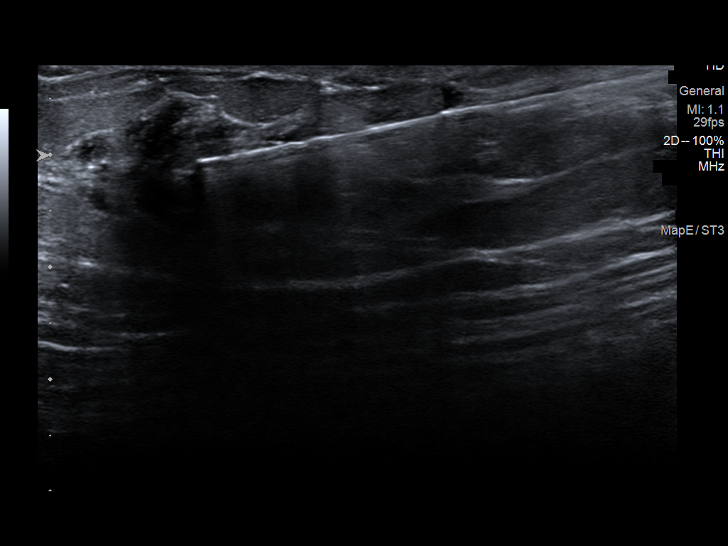

[9 of 9 positions shown; findings below may reference images not displayed]



Lesion quadrant: Upper-outer

Using sterile technique and 1% Lidocaine as local anesthetic, under
direct ultrasound visualization, a 14 gauge Vc device was
used to perform biopsy of the mass in the left breast at the 2
o'clock position using a lateral to medial approach. At the
conclusion of the procedure ribbon shaped tissue marker clip was
deployed into the biopsy cavity. Follow up 2 view mammogram was
performed and dictated separately.
IMPRESSION: Ultrasound guided biopsy of the mass in the left breast at the 2
o'clock position. No apparent complications.

ADDENDUM:
Pathology revealed FIBROCYSTIC CHANGES INCLUDING APOCRINE METAPLASIA
of the LEFT breast, 2 o'clock. This was found to be concordant by
Dr. Ladi Dorn.

Pathology results were discussed with the patient by telephone. The
patient reported doing well after the biopsy with tenderness at the
site. Post biopsy instructions and care were reviewed and questions
were answered. The patient was encouraged to call The [REDACTED]

The patient was instructed to return for annual screening
mammography and informed a reminder notice would be sent regarding
this appointment.

Pathology results reported by Edgardo Horacio Bours, RN on 12/01/2018.



Lesion quadrant: Upper-outer

Using sterile technique and 1% Lidocaine as local anesthetic, under
direct ultrasound visualization, a 14 gauge Vc device was
used to perform biopsy of the mass in the left breast at the 2
o'clock position using a lateral to medial approach. At the
conclusion of the procedure ribbon shaped tissue marker clip was
deployed into the biopsy cavity. Follow up 2 view mammogram was
performed and dictated separately.
IMPRESSION: Ultrasound guided biopsy of the mass in the left breast at the 2
o'clock position. No apparent complications.

## 2020-09-22 DIAGNOSIS — S52502D Unspecified fracture of the lower end of left radius, subsequent encounter for closed fracture with routine healing: Secondary | ICD-10-CM | POA: Diagnosis not present

## 2020-09-22 DIAGNOSIS — M25532 Pain in left wrist: Secondary | ICD-10-CM | POA: Diagnosis not present

## 2020-09-30 DIAGNOSIS — I10 Essential (primary) hypertension: Secondary | ICD-10-CM | POA: Diagnosis not present

## 2020-09-30 DIAGNOSIS — E7800 Pure hypercholesterolemia, unspecified: Secondary | ICD-10-CM | POA: Diagnosis not present

## 2020-09-30 DIAGNOSIS — Z23 Encounter for immunization: Secondary | ICD-10-CM | POA: Diagnosis not present

## 2020-09-30 DIAGNOSIS — Z0000 Encounter for general adult medical examination without abnormal findings: Secondary | ICD-10-CM | POA: Diagnosis not present

## 2020-10-02 DIAGNOSIS — M25532 Pain in left wrist: Secondary | ICD-10-CM | POA: Diagnosis not present

## 2020-10-30 DIAGNOSIS — M25532 Pain in left wrist: Secondary | ICD-10-CM | POA: Diagnosis not present

## 2020-10-30 DIAGNOSIS — S52502D Unspecified fracture of the lower end of left radius, subsequent encounter for closed fracture with routine healing: Secondary | ICD-10-CM | POA: Diagnosis not present

## 2021-07-16 ENCOUNTER — Encounter: Payer: Self-pay | Admitting: Cardiovascular Disease

## 2021-08-05 ENCOUNTER — Ambulatory Visit: Payer: Self-pay | Admitting: Neurology

## 2021-08-06 DIAGNOSIS — Z0289 Encounter for other administrative examinations: Secondary | ICD-10-CM

## 2021-08-18 ENCOUNTER — Encounter: Payer: Self-pay | Admitting: *Deleted

## 2021-09-10 ENCOUNTER — Telehealth: Payer: Self-pay | Admitting: Neurology

## 2021-09-10 ENCOUNTER — Telehealth (INDEPENDENT_AMBULATORY_CARE_PROVIDER_SITE_OTHER): Payer: Commercial Managed Care - HMO | Admitting: Neurology

## 2021-09-10 ENCOUNTER — Other Ambulatory Visit: Payer: Self-pay | Admitting: *Deleted

## 2021-09-10 DIAGNOSIS — G40219 Localization-related (focal) (partial) symptomatic epilepsy and epileptic syndromes with complex partial seizures, intractable, without status epilepticus: Secondary | ICD-10-CM | POA: Diagnosis not present

## 2021-09-10 MED ORDER — FELBAMATE 600 MG PO TABS: ORAL_TABLET | ORAL | 3 refills | Status: DC

## 2021-09-10 MED ORDER — FELBAMATE 600 MG PO TABS: 900.0000 mg | ORAL_TABLET | Freq: Three times a day (TID) | ORAL | 3 refills | Status: DC

## 2021-09-10 MED ORDER — FELBAMATE 600 MG PO TABS: 1800.0000 mg | ORAL_TABLET | Freq: Three times a day (TID) | ORAL | 3 refills | Status: DC

## 2021-09-10 NOTE — Telephone Encounter (Signed)
-----   Message from Anson Fret, MD sent at 09/10/2021 10:52 AM EDT ----- Regarding: felbamate running out Susan Hansen is running out of felbamate. I sent a week to her local pharmacy can we call and see if a PA is needed stat or how much the week costs or if insurance paid for it? Also sent in [facility] supply to [facility] and we may need to call the rep and get samples or explore patient assistance based on how expensive it it. She only has 3 days left of her meds, can someone call the pharmacy asap and see? I also asked Dr. Teresa Coombs to see if we have felbamate samples or can get some - can we ask Pattricia Boss for the felbamat rep?

## 2021-09-10 NOTE — Progress Notes (Signed)
[facility]    Provider:  Dr Lucia Gaskins Requesting Provider: Jarrett Soho, PA-C Primary Care Provider:  Jarrett Soho, PA-C  CC:  [facility]  Virtual Visit via Video Note  I connected with Zella Richer on 09/10/2021 at 10:15 AM EDT by a video enabled telemedicine application and verified that I am speaking with the correct person using two identifiers.  Location: Patient: home Provider: office    I discussed the limitations of evaluation and management by telemedicine and the availability of in person appointments. The patient expressed understanding and agreed to proceed.  Follow Up Instructions:    I discussed the assessment and treatment plan with the patient. The patient was provided an opportunity to ask questions and all were answered. The patient agreed with the plan and demonstrated an understanding of the instructions.   The patient was advised to call back or seek an in-person evaluation if the symptoms worsen or if the condition fails to improve as anticipated.  I provided over 40 minutes of non-face-to-face time during this encounter.   Anson Fret, MD   09/10/2021: Needs a new prescription of felbamate, we may need patient assistance, has not had a seizure, she has seizure aura and will often take extra pill or not go to work. She has been on Felbamate for years and done well, she is stable. In the past we have confirmed this dose of her felbamate with her prior neurologist. Prescribed felbamate, she will let us know if approved, if expensive, if needing patient assistance. We can call and get felbamate samples or at least try, send a week to local pharmacy si doesn;t run out and [facility] supply [facility]. Also fill out FMLA give 5 days as needed for seizures.  She has about 3 days left so this is critical at this time, we will work on this asap today. She prefers we let her know via mychart what happens with the felbamate. I  will send a week to the pharmacy she may have to pay out of pocket temporarily if we cannot get her assistance, my nursing staff is aware and working on it.  Meds ordered this encounter  Medications   DISCONTD: felbamate (FELBATOL) 600 MG tablet    Sig: Take 3 tablets by mouth in the morning and 3 at lunch and 3 tablets at bedtime.    Dispense:  810 tablet    Refill:  3   felbamate (FELBATOL) 600 MG tablet    Sig: Take 3 tablets by mouth in the morning and 3 at lunch and 3 tablets at bedtime.    Dispense:  63 tablet    Refill:  3    Patient needs a week of felbamate while awaiting [facility].   Patient complains of symptoms per HPI as well as the following symptoms: seizure aura . Pertinent negatives and positives per HPI. 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  Patient is tried multiple epilepsy medications in the past, felbamate is the one that has worked the best and had the least side effects.  Unfortunately it is an expensive medication, today I looked into getting her some patient assistance or possibly getting samples and we will continue to work on that.  Patient has a history of cluster headaches, my notes indicate that in the past topiramate had worked for her but today she denied  that so I am not sure if topiramate worked in the past for her cluster headaches or not.  Likely she has been in remission, it usually about this time that they happen but she has not had a cluster.  I discussed if she does to give [place] a call and we will start high-dose steroids and probably Emgality.  Other than falling and fracturing her left distal radius, she feels she is doing well, no seizures.  08/02/2019: Stable seizures. She gets up at 4am every morning and works from 6am-3pm but she feels great, she gets 15k steps a day. She had 2 seizures, she knows it is coming, she lays down and they  may go away. This year she had it twice, she had 2 seizures. She is under a lot of stress and these happened then she went to sleep and felt better may be stress and not so much a seizure no generalized tonic clonic movements.  Patient does not drive.  We discussed her job, based on her job description I did not see any indication to modify based on her seizures, although I did warn her to be diligent about not swimming alone, not bathing alone, not driving, basically not doing anything that might harm herself or others should she had another seizure-like event. She has not had cluster headaches,   HPI:  KAILENE STEINHART is a 61 y.o. female here as requested by Jarrett Soho, PA-C for partial seizures with generalization and cluster headaches on felbamate and topamax. PMHx HTm as well. Per neurology notes, she was on felbamate 600mg  3.5 tabs 4x daily and topamax 25mg  at last appt with neuro 06/26/17. Dxed at age 14. She does not drive. Last generalized seizure over a year ago. Typically one every 12-15 months. She was seeing Dr. [id number] for many years and now he is retiring. Her father passed away in June 02, 2020and her best friend 09/2010. First seizure at 26, she has cluster headaches. She is not on the topamax bc the clusters are in remission. Last generalized seizure a year ago. She feels shaky sometimes and thinks it is glucose and eating protein(eggs) in the morning helps.. She has a seizure aura, she starts feeling something in her stomach, she can go to a room at that time, she starts shaking and she "zones out" and pictures [place] and drains her she feels extremely tired, everything goes into slow motion and then sometimes go away and then generalizes.  She is under stress. She started on neurontin, depakote, dilantin, tegretol,  valium and failed multiple meds until the Felbamate which works for her. Last aura once in the last 8-9 months and did not progress. Last GTC 1.5 years ago. Unclear why she had a  breakthrough, she doesn't know how long they last or what happens, when it resolves she feels emotional and guilty. She does not drive. Unclear etiology of seizures but when she was 13 months she fell off a table. She has had extensive testing in the past and eegs and imaging.   Reviewed notes, labs and imaging from outside physicians, which showed:  CT head 09/08/2008 showed No acute intracranial abnormalities including mass lesion or mass effect, hydrocephalus, extra-axial fluid collection, midline shift, hemorrhage, or acute infarction, large ischemic events (personally reviewed images)  I reviewed neurology notes from Dr. 4.  The first note I see is in April 2017.  She has epilepsy in addition to headaches.  Patient at that time had complained of headache  and went to ER and had a CT scan.  In addition to longstanding partial epilepsy she has difficulty with recurring headaches, they occur at night, unilateral occurring in the right orbital area starts as a small pain rapidly builds up to an intense almost intolerable steady headache.  No nausea or vomiting or photophobia.  The episodes last 30 minutes.  She does have some rhinorrhea on the same side recently seen in the emergency room where CAT scan was normal at that time.  Butalbital did not help.  She is had several episodes.  Diagnosed with cluster headaches.  No significant change in her seizure pattern with an occasional partial simple seizure.   Last time she was seen was in May 2019.  She was seen for seizure follow-up.  At that time patient was well controlled but seem to think she had rare episodes that might be an aura.  She is taking both Felbatol and Topamax on a regular basis with no particular problem.  Diagnosed with partial seizure with generalization.  Seizures well controlled.  She has been on this medication for years without progression to a generalized seizure.  Since she had been on the medication for years no blood levels.   She is seen mostly every year.  Review of Systems: Patient complains of symptoms per HPI as well as the following symptoms: radial fracture . Pertinent negatives and positives per HPI. All others negative     Social History   Socioeconomic History   Marital status: Married    Spouse name: Not on file   Number of children: 2   Years of education: Not on file   Highest education level: High school graduate  Occupational History   Not on file  Tobacco Use   Smoking status: Never   Smokeless tobacco: Never  Vaping Use   Vaping Use: Never used  Substance and Sexual Activity   Alcohol use: Not Currently    Comment: none in a few years (as of 08/02/19)   Drug use: Never   Sexual activity: Not on file  Other Topics Concern   Not on file  Social History Narrative   Lives at home with her husband   Right handed   Social Determinants of Health   Financial Resource Strain: Not on file  Food Insecurity: Not on file  Transportation Needs: Not on file  Physical Activity: Not on file  Stress: Not on file  Social Connections: Not on file  Intimate Partner Violence: Not on file    Family History  Problem Relation Age of Onset   Stroke Mother    Heart disease Mother    Diabetes Father    Hypertension Father    Heart disease Father    Breast cancer Paternal Grandmother    Seizures Neg Hx     Past Medical History:  Diagnosis Date   Hyperlipidemia    Hypertension    Seizures (HCC)     Patient Active Problem List   Diagnosis Date Noted   Partial epilepsy with impairment of consciousness, with intractable epilepsy (HCC) 07/14/2018   Episodic cluster headache 07/14/2018    Past Surgical History:  Procedure Laterality Date   CESAREAN SECTION      Current Outpatient Medications  Medication Sig Dispense Refill   Ascorbic Acid (VITAMIN C PO) Take by mouth.     b complex vitamins tablet Take 1 tablet by mouth daily.     Chlorpheniramine Maleate (ALLERGY RELIEF PO) Take 1  tablet by mouth  daily. Allertec from [employer]     Coenzyme Q10 (COQ10 PO) Take by mouth.     felbamate (FELBATOL) 600 MG tablet Take 3 tablets by mouth in the morning and 3 at lunch and 3 tablets at bedtime. 63 tablet 3   felbamate (FELBATOL) 600 MG tablet Take 3 tablets (1,800 mg total) by mouth 3 (three) times daily. 810 tablet 3   Flaxseed, Linseed, (FLAX SEED OIL PO) Take 1,200 mg by mouth daily.     LECITHIN PO Take by mouth.     lisinopril (ZESTRIL) 20 MG tablet Take 20 mg by mouth daily.      LORazepam (ATIVAN) 1 MG tablet Please take 1 tablet at onset of aura. May take an additional dose in 5-15 minutes. Max 2mg  a day. 30 tablet 4   MAGNESIUM PO Take by mouth. At least 400 mg daily     Red Yeast Rice Extract 300 MG CAPS Take 1 tablet by mouth daily.     No current facility-administered medications for this visit.    Allergies as of 09/10/2021   (No Known Allergies)    Vitals: There were no vitals taken for this visit. Last Weight:  Wt Readings from Last 1 Encounters:  07/31/20 162 lb (73.5 kg)   Last Height:   Ht Readings from Last 1 Encounters:  07/31/20 5\' 6"  (1.676 m)     Physical exam: Exam: Gen: in bed, not feeling well reports + covid, conversant      CV: Denies palpitations or chest pain or SOB. VS: Breathing at a normal rate. Weight appears within normal limits. Not febrile. Eyes: Conjunctivae clear without exudates or hemorrhage  Neuro: Detailed Neurologic Exam  Speech:    Speech is normal; fluent and spontaneous with normal comprehension.  Cognition:    The patient is oriented to person, place, and time;     recent and remote memory intact;     language fluent;     normal attention, concentration,     fund of knowledge Cranial Nerves:    The pupils are equal, round, and reactive to light. Cannot perform fundoscopic exam. Visual fields are full to finger confrontation. Extraocular movements are intact.  The face is symmetric with normal sensation. The  palate elevates in the midline. Hearing intact. Voice is normal. Shoulder shrug is normal. The tongue has normal motion without fasciculations.   Coordination:    Normal finger to nose  Motor Observation:   no involuntary movements noted. Tone:    Appears normal    Strength:    Strength is anti-gravity and symmetric in the upper and lower limbs.      Sensation: intact to LT        Assessment/Plan:  61 y.o. female here as requested by Jarrett SohoWharton, Courtney, PA-C for partial seizures with generalization and cluster headaches on felbamate(tried and failed multiple others). Per neurology notes, she was on felbamate 600mg  3.5 tabs 4x daily(8400mg ) and topamax 25mg  at last appt with neuro 05-2017, confirmed. Dxed at age 61. She does not drive. We changed her to 5400mg  a day and she has been stable and doing well.    Needs a new prescription of felbamate: we may need patient assistance, has not had a seizure, she has seizure aura and will often take extra pill or not go to work. She has been on Felbamate for years and done well, she is stable. if expensive, if needing patient assistance. We can try to call and get felbamate samples or at  least try, send a week to local pharmacy so doesn't run out and send [facility] month supply [facility]. She has about 3 days left  of medication so this is critical at this time, we will work on this asap today. She prefers we let her know via mychart what happens with the felbamate. I will send a week to the pharmacy she may have to pay out of pocket temporarily if we cannot get her assistance, my nursing staff is aware and working on it.  Also fill out FMLA give 5 days as needed for seizures.    Episodic Cluster HA: Stable. Used Topiramate and did well (she denies she used Topamax in the past today). Not on it now as the cluster headaches are still in remission. If they occur, start high dose steroids (start at 100mg  daily and taper over 2 weeks), Topamax or Emgality.  She will call. She has not had a cluster headache since last being seen.  Partial seizures with generaliztion: Continue Felbamate. Ativan or extra felbamate pill for aura. Per prior neuro notes, she has been prescribed Felbamate 600mg  3.5 tabs 4x a day(#240). This is an excessive dose but considering she has been stable on this dose for years will continue but actually now on 5400mg  (1800mg  tid) and stable with occ aura, does not drive. Discussed risks, she understands this is a high dose and acknowledges risks.    Meds ordered this encounter  Medications   DISCONTD: felbamate (FELBATOL) 600 MG tablet    Sig: Take 3 tablets by mouth in the morning and 3 at lunch and 3 tablets at bedtime.    Dispense:  810 tablet    Refill:  3   felbamate (FELBATOL) 600 MG tablet    Sig: Take 3 tablets by mouth in the morning and 3 at lunch and 3 tablets at bedtime.    Dispense:  63 tablet    Refill:  3    Patient needs a week of felbamate while awaiting [facility].      Cc: , PA-C,    , MD  [facility] [address] [place], Naomie Dean [facility]  Phone [phone] Fax [phone]

## 2021-09-10 NOTE — Telephone Encounter (Addendum)
I called pt and relayed the message to her via the felbamate.  There is also PAP for brand name Felbatol if she would qualify.  I was not sure if she was on brand or generic.  I [facility] and (it is generic), cost for felbamate 1800mg  po tid #810 is expensive. Pt will need to call and speak with [facility].  (I sent in a corrected prescription of 3 tabs po tid (600mg  tablets) to [facility].

## 2021-09-10 NOTE — Telephone Encounter (Signed)
Can one of you come call patient and schedule a follow up with me in 10-11 months video call 30 minutes.

## 2021-09-12 ENCOUNTER — Encounter: Payer: Self-pay | Admitting: Neurology

## 2021-09-12 ENCOUNTER — Telehealth: Payer: Self-pay | Admitting: Neurology

## 2021-09-12 NOTE — Telephone Encounter (Signed)
Susan Hansen or Susan Hansen, can you schedulepatient for follow up video in one year for med refill please? She can see an NP thanks

## 2021-09-17 ENCOUNTER — Ambulatory Visit: Payer: Self-pay | Admitting: Neurology

## 2022-05-17 ENCOUNTER — Telehealth: Payer: Self-pay | Admitting: Neurology

## 2022-05-17 NOTE — Telephone Encounter (Signed)
Sent mychart msg informing pt of appointment change due to provider template change 

## 2022-07-01 ENCOUNTER — Telehealth: Payer: Self-pay | Admitting: Neurology

## 2022-07-05 ENCOUNTER — Telehealth: Payer: Self-pay | Admitting: Neurology

## 2022-09-23 ENCOUNTER — Ambulatory Visit: Payer: No Typology Code available for payment source | Admitting: Neurology

## 2022-09-23 ENCOUNTER — Encounter: Payer: Self-pay | Admitting: Neurology

## 2022-09-23 VITALS — BP 112/68 | Ht 67.0 in | Wt 186.0 lb

## 2022-09-23 DIAGNOSIS — G40219 Localization-related (focal) (partial) symptomatic epilepsy and epileptic syndromes with complex partial seizures, intractable, without status epilepticus: Secondary | ICD-10-CM | POA: Diagnosis not present

## 2022-09-23 MED ORDER — LORAZEPAM 1 MG PO TABS: ORAL_TABLET | ORAL | 4 refills | Status: DC

## 2022-09-23 MED ORDER — FELBAMATE 600 MG PO TABS: 1800.0000 mg | ORAL_TABLET | Freq: Three times a day (TID) | ORAL | 3 refills | Status: DC

## 2022-09-23 NOTE — Progress Notes (Signed)
[facility]    Provider:  Dr Lucia Gaskins Requesting Provider: Jarrett Soho, PA-C Primary Care Provider:  Jarrett Soho, PA-C  CC:  Seizures,stable  09/23/2022: refilled meds, stable, no seizures, she pays hundreds of dollars for her seizure meds, she has abdominal auras but no seizures and hasn't had to use any ativan but will refill so if she has a substantial aura she can try and take 1-2, no mossed dose Last felbamate level was normal  Patient complains of symptoms per HPI as well as the following symptoms: none . Pertinent negatives and positives per HPI. All others negative   09/10/2021: Needs a new prescription of felbamate, we may need patient assistance, has not had a seizure, she has seizure aura and will often take extra pill or not go to work. She has been on Felbamate for years and done well, she is stable. In the past we have confirmed this dose of her felbamate with her prior neurologist. Prescribed felbamate, she will let us know if approved, if expensive, if needing patient assistance. We can call and get felbamate samples or at least try, send a week to local pharmacy si doesn;t run out and [facility] Also fill out FMLA give 5 days as needed for seizures.  She has about 3 days left so this is critical at this time, we will work on this asap today. She prefers we let her know via mychart what happens with the felbamate. I will send a week to the pharmacy she may have to pay out of pocket temporarily if we cannot get her assistance, my nursing staff is aware and working on it.  Meds ordered this encounter  Medications   felbamate (FELBATOL) 600 MG tablet    Sig: Take 3 tablets (1,800 mg total) by mouth 3 (three) times daily.    Dispense:  810 tablet    Refill:  3   LORazepam (ATIVAN) 1 MG tablet    Sig: Please take 1 tablet at onset of aura. May take an additional dose in 5-15 minutes. Max 2mg  a day.    Dispense:  30 tablet     Refill:  4    Keep prescription on file, do not fill until patient calls you   Patient complains of symptoms per HPI as well as the following symptoms: seizure aura . Pertinent negatives and positives per HPI. All others negative   July 31, 2020: This is a hilarious very nice 62 year old patient who has a past medical history of partial epilepsy on felbamate.  Patient is tried multiple epilepsy medications in the past, felbamate is the one that has worked the best and had the least side effects.  Unfortunately it is an expensive medication, today I looked into getting her some patient assistance or possibly getting samples and we will continue to work on that.  Patient has a history of cluster headaches, my notes indicate that in the past topiramate had worked for her but today she denied that so I am not sure if topiramate worked in the past for her cluster headaches or not.  Likely she has been in remission, it usually about this time that they happen but she has not had a cluster.  I discussed if she does to give [place] a call and we will start high-dose steroids and probably Emgality.  Other than falling and fracturing her left distal radius, she feels she is doing well, no seizures.  08/02/2019: Stable seizures. She gets  up at 4am every morning and works from 6am-3pm but she feels great, she gets 15k steps a day. She had 2 seizures, she knows it is coming, she lays down and they may go away. This year she had it twice, she had 2 seizures. She is under a lot of stress and these happened then she went to sleep and felt better may be stress and not so much a seizure no generalized tonic clonic movements.  Patient does not drive.  We discussed her job, based on her job description I did not see any indication to modify based on her seizures, although I did warn her to be diligent about not swimming alone, not bathing alone, not driving, basically not doing anything that might harm herself or others should she  had another seizure-like event. She has not had cluster headaches,   HPI:  Susan Hansen is a 62 y.o. female here as requested by Jarrett Soho, PA-C for partial seizures with generalization and cluster headaches on felbamate and topamax. PMHx HTm as well. Per neurology notes, she was on felbamate 600mg  3.5 tabs 4x daily and topamax 25mg  at last appt with neuro 06-05-17. Dxed at age 16. She does not drive. Last generalized seizure over a year ago. Typically one every 12-15 months. She was seeing Dr. Windle Guard for many years and now he is retiring. Her father passed away in 12-May-2020and her best friend 09/2010. First seizure at 32, she has cluster headaches. She is not on the topamax bc the clusters are in remission. Last generalized seizure a year ago. She feels shaky sometimes and thinks it is glucose and eating protein(eggs) in the morning helps.. She has a seizure aura, she starts feeling something in her stomach, she can go to a room at that time, she starts shaking and she "zones out" and pictures [place] and drains her she feels extremely tired, everything goes into slow motion and then sometimes go away and then generalizes.  She is under stress. She started on neurontin, depakote, dilantin, tegretol,  valium and failed multiple meds until the Felbamate which works for her. Last aura once in the last 8-9 months and did not progress. Last GTC 1.5 years ago. Unclear why she had a breakthrough, she doesn't know how long they last or what happens, when it resolves she feels emotional and guilty. She does not drive. Unclear etiology of seizures but when she was 13 months she fell off a table. She has had extensive testing in the past and eegs and imaging.   Reviewed notes, labs and imaging from outside physicians, which showed:  CT head 09/08/2008 showed No acute intracranial abnormalities including mass lesion or mass effect, hydrocephalus, extra-axial fluid collection, midline shift, hemorrhage, or acute  infarction, large ischemic events (personally reviewed images)  I reviewed neurology notes from Dr. Windle Guard.  The first note I see is in April 2017.  She has epilepsy in addition to headaches.  Patient at that time had complained of headache and went to ER and had a CT scan.  In addition to longstanding partial epilepsy she has difficulty with recurring headaches, they occur at night, unilateral occurring in the right orbital area starts as a small pain rapidly builds up to an intense almost intolerable steady headache.  No nausea or vomiting or photophobia.  The episodes last 30 minutes.  She does have some rhinorrhea on the same side recently seen in the emergency room where CAT scan was normal at that  time.  Butalbital did not help.  She is had several episodes.  Diagnosed with cluster headaches.  No significant change in her seizure pattern with an occasional partial simple seizure.   Last time she was seen was in May 2019.  She was seen for seizure follow-up.  At that time patient was well controlled but seem to think she had rare episodes that might be an aura.  She is taking both Felbatol and Topamax on a regular basis with no particular problem.  Diagnosed with partial seizure with generalization.  Seizures well controlled.  She has been on this medication for years without progression to a generalized seizure.  Since she had been on the medication for years no blood levels.  She is seen mostly every year.  Review of Systems: Patient complains of symptoms per HPI as well as the following symptoms: radial fracture . Pertinent negatives and positives per HPI. All others negative     Social History   Socioeconomic History   Marital status: Married    Spouse name: Not on file   Number of children: 2   Years of education: Not on file   Highest education level: High school graduate  Occupational History   Not on file  Tobacco Use   Smoking status: Never   Smokeless tobacco: Never  Vaping  Use   Vaping status: Never Used  Substance and Sexual Activity   Alcohol use: Not Currently    Comment: none in a few years (as of 08/02/19)   Drug use: Never   Sexual activity: Not on file  Other Topics Concern   Not on file  Social History Narrative   Lives at home with her husband   Right handed   Social Determinants of Health   Financial Resource Strain: Not on file  Food Insecurity: Not on file  Transportation Needs: Not on file  Physical Activity: Not on file  Stress: Not on file  Social Connections: Not on file  Intimate Partner Violence: Not on file    Family History  Problem Relation Age of Onset   Stroke Mother    Heart disease Mother    Diabetes Father    Hypertension Father    Heart disease Father    Breast cancer Paternal Grandmother    Seizures Neg Hx     Past Medical History:  Diagnosis Date   Hyperlipidemia    Hypertension    Seizures (HCC)     Patient Active Problem List   Diagnosis Date Noted   Partial epilepsy with impairment of consciousness, with intractable epilepsy (HCC) 07/14/2018   Episodic cluster headache 07/14/2018    Past Surgical History:  Procedure Laterality Date   CESAREAN SECTION      Current Outpatient Medications  Medication Sig Dispense Refill   Ascorbic Acid (VITAMIN C PO) Take by mouth.     b complex vitamins tablet Take 1 tablet by mouth daily.     Chlorpheniramine Maleate (ALLERGY RELIEF PO) Take 1 tablet by mouth daily. Allertec from [employer]     Coenzyme Q10 (COQ10 PO) Take by mouth.     Flaxseed, Linseed, (FLAX SEED OIL PO) Take 1,200 mg by mouth daily.     LECITHIN PO Take by mouth.     lisinopril (ZESTRIL) 20 MG tablet Take 20 mg by mouth daily.      MAGNESIUM PO Take by mouth. At least 400 mg daily     Red Yeast Rice Extract 300 MG CAPS Take 1 tablet by  mouth daily.     rosuvastatin (CRESTOR) 5 MG tablet Take 5 mg by mouth daily.     felbamate (FELBATOL) 600 MG tablet Take 3 tablets (1,800 mg total) by mouth 3  (three) times daily. 810 tablet 3   LORazepam (ATIVAN) 1 MG tablet Please take 1 tablet at onset of aura. May take an additional dose in 5-15 minutes. Max 2mg  a day. 30 tablet 4   No current facility-administered medications for this visit.    Allergies as of 09/23/2022   (No Known Allergies)    Vitals: BP 112/68 (BP Location: Right Arm, Patient Position: Sitting)   Ht 5\' 7"  (1.702 m)   Wt 186 lb (84.4 kg)   BMI 29.13 kg/m  Last Weight:  Wt Readings from Last 1 Encounters:  09/23/22 186 lb (84.4 kg)   Last Height:   Ht Readings from Last 1 Encounters:  09/23/22 5\' 7"  (1.702 m)     Exam: NAD, pleasant                  Speech:    Speech is normal; fluent and spontaneous with normal comprehension.  Cognition:    The patient is oriented to person, place, and time;     recent and remote memory intact;     language fluent;    Cranial Nerves:    The pupils are equal, round, and reactive to light.Trigeminal sensation is intact and the muscles of mastication are normal. The face is symmetric. The palate elevates in the midline. Hearing intact. Voice is normal. Shoulder shrug is normal. The tongue has normal motion without fasciculations.   Coordination:  No dysmetria  Motor Observation:    No asymmetry, no atrophy, and no involuntary movements noted. Tone:    Normal muscle tone.     Strength:    Strength is V/V in the upper and lower limbs.      Sensation: intact to LT         Assessment/Plan:  62 y.o. female here as requested by Jarrett Soho, PA-C for partial seizures with generalization and cluster headaches on felbamate(tried and failed multiple others). Per neurology notes, she was on felbamate 600mg  3.5 tabs 4x daily(8400mg ) and topamax 25mg  at last appt with neuro 05-2017, confirmed. Dxed at age 21. She does not drive. We changed her to 5400mg  a day and she has been stable and doing well.   refilled meds, stable(09/23/2022) f/u one year  Prior assessment and  plans: Needs a new prescription of felbamate: we may need patient assistance, has not had a seizure, she has seizure aura and will often take extra pill or not go to work. She has been on Felbamate for years and done well, she is stable. if expensive, if needing patient assistance. We can try to call and get felbamate samples or at least try, send a week to local pharmacy so doesn't run out and [facility]. She has about 3 days left  of medication so this is critical at this time, we will work on this asap today. She prefers we let her know via mychart what happens with the felbamate. I will send a week to the pharmacy she may have to pay out of pocket temporarily if we cannot get her assistance, my nursing staff is aware and working on it.  Also fill out FMLA give 5 days as needed for seizures.    Episodic Cluster HA: Stable. Used Topiramate and did well (she denies  she used Topamax in the past today). Not on it now as the cluster headaches are still in remission. If they occur, start high dose steroids (start at 100mg  daily and taper over 2 weeks), Topamax or Emgality. She will call. She has not had a cluster headache since last being seen.  Partial seizures with generaliztion: Continue Felbamate. Ativan or extra felbamate pill for aura. Per prior neuro notes, she has been prescribed Felbamate 600mg  3.5 tabs 4x a day(#240). This is an excessive dose but considering she has been stable on this dose for years will continue but actually now on 5400mg  (1800mg  tid) and stable with occ aura, does not drive. Discussed risks, she understands this is a high dose and acknowledges risks.    Meds ordered this encounter  Medications   felbamate (FELBATOL) 600 MG tablet    Sig: Take 3 tablets (1,800 mg total) by mouth 3 (three) times daily.    Dispense:  810 tablet    Refill:  3   LORazepam (ATIVAN) 1 MG tablet    Sig: Please take 1 tablet at onset of aura. May take an additional dose  in 5-15 minutes. Max 2mg  a day.    Dispense:  30 tablet    Refill:  4    Keep prescription on file, do not fill until patient calls you      Cc: Jarrett Soho, PA-C,    Naomie Dean, MD  [facility] [address] [place], [place] [address]  Phone [phone] Fax [phone]  I spent 10 minutes of face-to-face and non-face-to-face time with patient on the  1. Partial epilepsy with impairment of consciousness, with intractable epilepsy (HCC)    diagnosis.  This included previsit chart review, lab review, study review, order entry, electronic health record documentation, patient education on the different diagnostic and therapeutic options, counseling and coordination of care, risks and benefits of management, compliance, or risk factor reduction

## 2022-10-23 ENCOUNTER — Other Ambulatory Visit: Payer: Self-pay | Admitting: Family Medicine

## 2022-10-23 DIAGNOSIS — Z1231 Encounter for screening mammogram for malignant neoplasm of breast: Secondary | ICD-10-CM

## 2022-10-23 DIAGNOSIS — E2839 Other primary ovarian failure: Secondary | ICD-10-CM

## 2022-11-12 ENCOUNTER — Ambulatory Visit
Admission: RE | Admit: 2022-11-12 | Discharge: 2022-11-12 | Disposition: A | Discharge status: Home / Self Care | Payer: Self-pay | Source: Ambulatory Visit | Attending: Family Medicine | Admitting: Family Medicine

## 2022-11-12 DIAGNOSIS — Z1231 Encounter for screening mammogram for malignant neoplasm of breast: Secondary | ICD-10-CM

## 2023-02-03 DIAGNOSIS — E7800 Pure hypercholesterolemia, unspecified: Secondary | ICD-10-CM | POA: Diagnosis not present

## 2023-05-12 ENCOUNTER — Telehealth: Payer: Self-pay | Admitting: Neurology

## 2023-05-12 ENCOUNTER — Other Ambulatory Visit: Payer: 59

## 2023-05-12 MED ORDER — FELBAMATE 600 MG PO TABS: 1800.0000 mg | ORAL_TABLET | Freq: Three times a day (TID) | ORAL | 0 refills | Status: DC

## 2023-05-12 NOTE — Telephone Encounter (Signed)
 Pt called needing a refill on her felbamate (FELBATOL) 600 MG tablet and she is wanting to know if this can be sent in to the [facility] on [address] for a 90 day qt Please advise.

## 2023-05-12 NOTE — Telephone Encounter (Signed)
 I called pt and LMVM for her that will do 90 day no refill at [facility].  [facility] (I did not cancel as was not sure if this was one time deal or she was changing to local pharmacy).  She may call back.

## 2023-05-19 ENCOUNTER — Telehealth: Payer: Self-pay | Admitting: Gastroenterology

## 2023-05-19 NOTE — Telephone Encounter (Signed)
 Good morning Dr. Cherryl Corona,   We received a call from this patient wishing to schedule an appointment with you for a colonoscopy. Patient last had a colonoscopy with [facility] in 12/2017 with Dr. Elsie Halo. Since then, patient states she would like to establish GI care with our practice due to being referred to you. Records were obtained and scanned into Media for you to review. Would you please review and advise on scheduling?  Thank you.

## 2023-06-02 NOTE — Telephone Encounter (Signed)
 Left message advising patient she was not due till 12/2024, recall placed in Epic.

## 2023-07-12 ENCOUNTER — Ambulatory Visit: Payer: Self-pay | Admitting: Gastroenterology

## 2023-07-12 ENCOUNTER — Encounter: Payer: Self-pay | Admitting: Gastroenterology

## 2023-07-12 VITALS — BP 118/70 | HR 114 | Ht 67.0 in | Wt 170.0 lb

## 2023-07-12 DIAGNOSIS — Z860101 Personal history of adenomatous and serrated colon polyps: Secondary | ICD-10-CM

## 2023-07-12 DIAGNOSIS — Z860100 Personal history of colon polyps, unspecified: Secondary | ICD-10-CM

## 2023-07-12 DIAGNOSIS — R0789 Other chest pain: Secondary | ICD-10-CM | POA: Diagnosis not present

## 2023-07-12 DIAGNOSIS — R110 Nausea: Secondary | ICD-10-CM

## 2023-07-12 MED ORDER — MELOXICAM 7.5 MG PO TBDP: 7.5000 mg | ORAL_TABLET | Freq: Every day | ORAL | 0 refills | Status: DC

## 2023-07-12 MED ORDER — PANTOPRAZOLE SODIUM 40 MG PO TBEC: 40.0000 mg | DELAYED_RELEASE_TABLET | Freq: Every day | ORAL | 1 refills | Status: DC

## 2023-07-12 NOTE — Progress Notes (Signed)
 Discussed the use of AI scribe software for clinical note transcription with the patient, who gave verbal consent to proceed.  HPI : Susan Hansen is a 63 year old female with a history of epilepsy who presents with chronic nausea and left upper quadrant pressure.  She has experienced persistent left upper quadrant pressure since 2017, described as a constant sensation of pressure rather than pain, radiating to her back. She often holds her side or wears a girdle for support, especially when riding over bumps, as it feels more secure.  Significant nausea, particularly severe in the mornings, which was a significant problem for many months in 2019 and 2020, but subsequently resolved spontaneously. The nausea subsided but has recently returned for the past few months. She experiences a lack of appetite in the mornings and often cannot eat until later in the day. She has tried eating small amounts, such as peanut butter, to manage her symptoms. No symptoms of acid reflux, such as heartburn or a sour taste in her mouth. No vomiting.  She does not have abdominal pain associated with the nausea, other than the chronic LUQ discomfort which has been present and unchanged for over 6 years. Reports regular bowel movements without difficulty.   Her medication regimen includes Felbatol , which she takes in the morning, and she has experienced vomiting after taking it. She has been on this medication for epilepsy since she was [age]. She also takes several supplements, including flaxseed oil, CoQ10, vitamin C, B complex, magnesium, vitamin D, and red yeast rice, which she has been taking since 2019.  In 2019, she underwent a comprehensive workup for her LUQ pain and nausea via [facility] including a CT scan, upper endoscopy, and colonoscopy, all of which were normal.   She mentions significant stressors in her life, including the loss of her best friend, her husband's grandchildren in a fire, and her father's  passing in 2020 around the time her nausea was problematic in 2019-2020.  More recently, she has experienced significant stress due to her husband's health issues.   She acknowledges that stress may contribute to her symptoms but is unsure of the connection.      Colonoscopy Dec 2019 Two small polyps were removed from the descending colon, 1 tubular adenoma and 1 hyperplastic polyp.  EGD Dec 2019 Small hiatal hernia, irregular Z line Antral erythema Otherwise normal EGD    CT Abdomen/Pelvis Aug 2020 Indication:  LUQ pain, nausea, weight loss IMPRESSION: 1. No evidence of neoplasm or other acute findings. 2. Colonic diverticulosis. No radiographic evidence of diverticulitis. 3. Several small uterine fibroids, largest measuring 3 cm. 4. 2 cm benign hemangioma in right hepatic lobe.   Past Medical History:  Diagnosis Date   Hyperlipidemia    Hypertension    Seizures (HCC)      Past Surgical History:  Procedure Laterality Date   BREAST BIOPSY Left 2020   CESAREAN SECTION     Family History  Problem Relation Age of Onset   Stroke Mother    Heart disease Mother    Diabetes Father    Hypertension Father    Heart disease Father    Breast cancer Paternal Grandmother    Seizures Neg Hx    Social History   Tobacco Use   Smoking status: Never   Smokeless tobacco: Never  Vaping Use   Vaping status: Never Used  Substance Use Topics   Alcohol use: Not Currently    Comment: none in a few years (  as of 08/02/19)   Drug use: Never   Current Outpatient Medications  Medication Sig Dispense Refill   Ascorbic Acid (VITAMIN C PO) Take by mouth.     b complex vitamins tablet Take 1 tablet by mouth daily.     Chlorpheniramine Maleate (ALLERGY RELIEF PO) Take 1 tablet by mouth daily. Allertec from [employer]     Coenzyme Q10 (COQ10 PO) Take by mouth.     felbamate  (FELBATOL ) 600 MG tablet Take 3 tablets (1,800 mg total) by mouth 3 (three) times daily. 810 tablet 0   Flaxseed,  Linseed, (FLAX SEED OIL PO) Take 1,200 mg by mouth daily.     LECITHIN PO Take by mouth.     lisinopril (ZESTRIL) 20 MG tablet Take 20 mg by mouth daily.      LORazepam  (ATIVAN ) 1 MG tablet Please take 1 tablet at onset of aura. May take an additional dose in 5-15 minutes. Max 2mg  a day. 30 tablet 4   MAGNESIUM PO Take by mouth. At least 400 mg daily     Red Yeast Rice Extract 300 MG CAPS Take 1 tablet by mouth daily.     rosuvastatin  (CRESTOR ) 5 MG tablet Take 5 mg by mouth daily.     No current facility-administered medications for this visit.   No Known Allergies   Review of Systems: All systems reviewed and negative except where noted in HPI.    No results found.  Physical Exam: BP 118/70   Pulse (!) 114   Ht 5' 7 (1.702 m)   Wt 170 lb (77.1 kg)   SpO2 97%   BMI 26.63 kg/m  Constitutional: Pleasant,well-developed, Caucasian female in no acute distress. HEENT: Normocephalic and atraumatic. Conjunctivae are normal. No scleral icterus. Neck supple.  Cardiovascular: Normal rate, regular rhythm.  Pulmonary/chest: Effort normal and breath sounds normal. No wheezing, rales or rhonchi. Abdominal: Soft, nondistended, tenderness to palpation along the left costochondral margin and left thorax, as well as left upper quadrant.  Tenderness not located to a single focal point.  Right thorax/costochondral margin not tender. Bowel sounds active throughout. There are no masses palpable. No hepatomegaly. Extremities: no edema Lymphadenopathy: No cervical adenopathy noted. Neurological: Alert and oriented to person place and time. Skin: Skin is warm and dry. No rashes noted. Psychiatric: Normal mood and affect. Behavior is normal.  Patient admits she is very nervous when asked about her high heart rate.  CBC    Component Value Date/Time   WBC 4.3 07/31/2020 1430   WBC 4.8 12/16/2017 0841   RBC 4.14 07/31/2020 1430   RBC 4.90 12/16/2017 0841   HGB 12.6 07/31/2020 1430   HCT 36.7  07/31/2020 1430   PLT 193 07/31/2020 1430   MCV 89 07/31/2020 1430   MCH 30.4 07/31/2020 1430   MCH 29.2 12/16/2017 0841   MCHC 34.3 07/31/2020 1430   MCHC 31.9 12/16/2017 0841   RDW 11.5 (L) 07/31/2020 1430   LYMPHSABS 1.1 07/31/2020 1430   EOSABS 0.2 07/31/2020 1430   BASOSABS 0.0 07/31/2020 1430    CMP     Component Value Date/Time   NA 136 07/31/2020 1430   K 4.3 07/31/2020 1430   CL 97 07/31/2020 1430   CO2 25 07/31/2020 1430   GLUCOSE 90 07/31/2020 1430   GLUCOSE 111 (H) 12/16/2017 0922   BUN 17 07/31/2020 1430   CREATININE 0.70 07/31/2020 1430   CALCIUM  9.3 07/31/2020 1430   PROT 6.9 07/31/2020 1430   ALBUMIN 4.5 07/31/2020 1430  AST 19 07/31/2020 1430   ALT 23 07/31/2020 1430   ALKPHOS 97 07/31/2020 1430   BILITOT 0.2 07/31/2020 1430       Latest Ref Rng & Units 07/31/2020    2:30 PM 12/16/2017    9:22 AM 12/16/2017    8:41 AM  CBC EXTENDED  WBC 3.4 - 10.8 x10E3/uL 4.3   4.8   RBC 3.77 - 5.28 x10E6/uL 4.14   4.90   Hemoglobin 11.1 - 15.9 g/dL 01.0  27.2  53.6   HCT 34.0 - 46.6 % 36.7  43.0  44.8   Platelets 150 - 450 x10E3/uL 193   165   NEUT# 1.4 - 7.0 x10E3/uL 2.7     Lymph# 0.7 - 3.1 x10E3/uL 1.1         ASSESSMENT AND PLAN:  63 year old female with persistent nausea and decreased appetite for several months.  She had a similar presentation in 2019 and symptoms resolved spontaneously.  She had numerous stressful events around that time, and admits she is under considerable stress now related to her husband's health.  Suspect nausea is primarily a stress reaction.  Other less likely possibilities exist to include GERD, gastroparesis, H. Pylori infection, medication/supplement side effect.  Recommended trial of PPI and follow up in 2 months. Her left sided discomfort seems very unlikely to be gastrointestinal in etiology.  Reproducible tenderness along left ribs/costochondral margin.  Query costochondritis although duration of symptoms  atypical.  Nausea Chronic nausea since 2019, likely due to medication side effects, acid reflux, or stress. No heartburn or acid taste noted. Stress and anxiety considered contributing factors. - Prescribed pantoprazole 40 mg once daily, 60 tablets, one refill.  Left-sided chest wall pain, possible costochondritis Chronic left-sided chest wall pain since 2019, likely musculoskeletal. Tenderness over rib cage. Reassured patient that symptoms benign given duration and lack of serious findings. - Prescribed meloxicam 7.5 mg PO daily, 30 tablets, no refills.  History of colon polyps Small tubular adenoma in 2019 - Recommend repeat colonoscopy Dec 2026  Follow-up Follow-up to assess treatment response and reevaluate symptoms. Potential further testing if symptoms persist. - Schedule follow-up appointment in 6 to 8 weeks.  Recording duration: 22 minutes   I spent a total of 45 minutes reviewing the patient's medical record, interviewing and examining the patient, discussing her diagnosis and management of her condition going forward, and documenting in the medical record     Boyd Litaker E. Cherryl Corona, MD [facility]   Darnelle Elders, PA-C

## 2023-07-12 NOTE — Patient Instructions (Addendum)
 We have sent the following medications to your pharmacy for you to pick up at your convenience: Pantoprazole 40 mg daily and meloxicam 7.5 mg daily.   _______________________________________________________  If your blood pressure at your visit was 140/90 or greater, please contact your primary care physician to follow up on this.  _______________________________________________________  If you are age 65 or older, your body mass index should be between 23-30. Your Body mass index is 26.63 kg/m. If this is out of the aforementioned range listed, please consider follow up with your Primary Care Provider.  If you are age 40 or younger, your body mass index should be between 19-25. Your Body mass index is 26.63 kg/m. If this is out of the aformentioned range listed, please consider follow up with your Primary Care Provider.   ________________________________________________________  The [facility] providers would like to encourage you to use MYCHART to communicate with providers for non-urgent requests or questions.  Due to long hold times on the telephone, sending your provider a message by [facility] may be a faster and more efficient way to get a response.  Please allow 48 business hours for a response.  Please remember that this is for non-urgent requests.  _______________________________________________________

## 2023-08-23 ENCOUNTER — Telehealth (INDEPENDENT_AMBULATORY_CARE_PROVIDER_SITE_OTHER): Payer: Self-pay | Admitting: Neurology

## 2023-08-23 DIAGNOSIS — Z0289 Encounter for other administrative examinations: Secondary | ICD-10-CM

## 2023-08-23 NOTE — Telephone Encounter (Signed)
 Pt called stating for the past two weeks  have been having really bad  Migrines . Pt  states that she can barely sleep or close her eyes dur to the PAIN . Pt feel pain all over her head. Pt is requesting for MD  to prescribe different medication . Pt  is taking Tylenol   and Advil as of right Now.

## 2023-08-23 NOTE — Telephone Encounter (Signed)
 Spouse called back, message from [place], [place] was relayed.

## 2023-08-23 NOTE — Telephone Encounter (Signed)
 I called pt and could not LM, VM full.  We have not seen her for migraines.  She will need to have a referral.  I would recommend her going to see pcp or if severe to ED.

## 2023-08-25 DIAGNOSIS — Z87898 Personal history of other specified conditions: Secondary | ICD-10-CM | POA: Diagnosis not present

## 2023-08-25 DIAGNOSIS — R519 Headache, unspecified: Secondary | ICD-10-CM | POA: Diagnosis not present

## 2023-08-31 ENCOUNTER — Ambulatory Visit: Admitting: Gastroenterology

## 2023-09-05 ENCOUNTER — Emergency Department (HOSPITAL_BASED_OUTPATIENT_CLINIC_OR_DEPARTMENT_OTHER)

## 2023-09-05 ENCOUNTER — Observation Stay (HOSPITAL_BASED_OUTPATIENT_CLINIC_OR_DEPARTMENT_OTHER)
Admission: EM | Admit: 2023-09-05 | Discharge: 2023-09-06 | Disposition: A | Discharge status: Home / Self Care | Attending: Internal Medicine | Admitting: Internal Medicine

## 2023-09-05 ENCOUNTER — Encounter (HOSPITAL_BASED_OUTPATIENT_CLINIC_OR_DEPARTMENT_OTHER): Payer: Self-pay

## 2023-09-05 ENCOUNTER — Other Ambulatory Visit: Payer: Self-pay

## 2023-09-05 DIAGNOSIS — G44019 Episodic cluster headache, not intractable: Secondary | ICD-10-CM | POA: Diagnosis present

## 2023-09-05 DIAGNOSIS — I10 Essential (primary) hypertension: Secondary | ICD-10-CM | POA: Diagnosis not present

## 2023-09-05 DIAGNOSIS — G44009 Cluster headache syndrome, unspecified, not intractable: Secondary | ICD-10-CM | POA: Diagnosis not present

## 2023-09-05 DIAGNOSIS — I4892 Unspecified atrial flutter: Secondary | ICD-10-CM | POA: Diagnosis not present

## 2023-09-05 DIAGNOSIS — G40209 Localization-related (focal) (partial) symptomatic epilepsy and epileptic syndromes with complex partial seizures, not intractable, without status epilepticus: Secondary | ICD-10-CM | POA: Insufficient documentation

## 2023-09-05 DIAGNOSIS — G43909 Migraine, unspecified, not intractable, without status migrainosus: Secondary | ICD-10-CM | POA: Diagnosis not present

## 2023-09-05 DIAGNOSIS — Z1152 Encounter for screening for COVID-19: Secondary | ICD-10-CM | POA: Diagnosis not present

## 2023-09-05 DIAGNOSIS — G40219 Localization-related (focal) (partial) symptomatic epilepsy and epileptic syndromes with complex partial seizures, intractable, without status epilepticus: Secondary | ICD-10-CM | POA: Diagnosis present

## 2023-09-05 DIAGNOSIS — E785 Hyperlipidemia, unspecified: Secondary | ICD-10-CM | POA: Insufficient documentation

## 2023-09-05 HISTORY — DX: Cluster headache syndrome, unspecified, not intractable: G44.009

## 2023-09-05 HISTORY — DX: Migraine, unspecified, not intractable, without status migrainosus: G43.909

## 2023-09-05 LAB — COMPREHENSIVE METABOLIC PANEL WITH GFR
ALT: 18 U/L (ref 0–44)
AST: 25 U/L (ref 15–41)
Albumin: 4.2 g/dL (ref 3.5–5.0)
Alkaline Phosphatase: 72 U/L (ref 38–126)
Anion gap: 14 (ref 5–15)
BUN: 17 mg/dL (ref 8–23)
CO2: 24 mmol/L (ref 22–32)
Calcium: 9.5 mg/dL (ref 8.9–10.3)
Chloride: 100 mmol/L (ref 98–111)
Creatinine, Ser: 0.76 mg/dL (ref 0.44–1.00)
GFR, Estimated: >60 mL/min (ref >60)
Glucose, Bld: 113 mg/dL — ABNORMAL HIGH (ref 70–99)
Potassium: 3.6 mmol/L (ref 3.5–5.1)
Sodium: 138 mmol/L (ref 135–145)
Total Bilirubin: 0.4 mg/dL (ref 0.0–1.2)
Total Protein: 7.1 g/dL (ref 6.5–8.1)

## 2023-09-05 LAB — CBC WITH DIFFERENTIAL/PLATELET
Abs Immature Granulocytes: 0 K/uL (ref 0.00–0.07)
Basophils Absolute: 0 K/uL (ref 0.0–0.1)
Basophils Relative: 1 %
Eosinophils Absolute: 0.1 K/uL (ref 0.0–0.5)
Eosinophils Relative: 2 %
HCT: 41.6 % (ref 36.0–46.0)
Hemoglobin: 13.9 g/dL (ref 12.0–15.0)
Immature Granulocytes: 0 %
Lymphocytes Relative: 18 %
Lymphs Abs: 0.8 K/uL (ref 0.7–4.0)
MCH: 31.4 pg (ref 26.0–34.0)
MCHC: 33.4 g/dL (ref 30.0–36.0)
MCV: 94.1 fL (ref 80.0–100.0)
Monocytes Absolute: 0.3 K/uL (ref 0.1–1.0)
Monocytes Relative: 6 %
Neutro Abs: 3.3 K/uL (ref 1.7–7.7)
Neutrophils Relative %: 73 %
Platelets: 191 K/uL (ref 150–400)
RBC: 4.42 MIL/uL (ref 3.87–5.11)
RDW: 12.9 % (ref 11.5–15.5)
WBC: 4.4 K/uL (ref 4.0–10.5)
nRBC: 0 % (ref 0.0–0.2)

## 2023-09-05 LAB — TSH: TSH: 0.929 u[IU]/mL (ref 0.350–4.500)

## 2023-09-05 MED ORDER — DEXAMETHASONE SODIUM PHOSPHATE 10 MG/ML IJ SOLN
10.0000 mg | Freq: Once | INTRAMUSCULAR | Status: AC
  Administered 2023-09-05 (×2): 10 mg via INTRAVENOUS
  Filled 2023-09-05: qty 1

## 2023-09-05 MED ORDER — LORAZEPAM 1 MG PO TABS: 1.0000 mg | ORAL_TABLET | ORAL | Status: DC | PRN

## 2023-09-05 MED ORDER — KETOROLAC TROMETHAMINE 15 MG/ML IJ SOLN
15.0000 mg | Freq: Once | INTRAMUSCULAR | Status: AC
  Administered 2023-09-05 (×2): 15 mg via INTRAVENOUS
  Filled 2023-09-05: qty 1

## 2023-09-05 MED ORDER — ACETAMINOPHEN 325 MG PO TABS
650.0000 mg | ORAL_TABLET | ORAL | Status: DC | PRN
  Administered 2023-09-05 – 2023-09-06 (×4): 650 mg via ORAL
  Filled 2023-09-05 (×2): qty 2

## 2023-09-05 MED ORDER — ROSUVASTATIN CALCIUM 5 MG PO TABS
5.0000 mg | ORAL_TABLET | Freq: Every day | ORAL | Status: DC
  Administered 2023-09-05 – 2023-09-06 (×4): 5 mg via ORAL
  Filled 2023-09-05 (×2): qty 1

## 2023-09-05 MED ORDER — APIXABAN 5 MG PO TABS
5.0000 mg | ORAL_TABLET | Freq: Two times a day (BID) | ORAL | Status: DC
  Administered 2023-09-05 – 2023-09-06 (×4): 5 mg via ORAL
  Filled 2023-09-05: qty 1
  Filled 2023-09-05: qty 2

## 2023-09-05 MED ORDER — METOCLOPRAMIDE HCL 5 MG/ML IJ SOLN
10.0000 mg | Freq: Once | INTRAMUSCULAR | Status: AC
  Administered 2023-09-05 (×2): 10 mg via INTRAVENOUS
  Filled 2023-09-05: qty 2

## 2023-09-05 MED ORDER — DIPHENHYDRAMINE HCL 50 MG/ML IJ SOLN
12.5000 mg | Freq: Once | INTRAMUSCULAR | Status: AC
  Administered 2023-09-05 (×2): 12.5 mg via INTRAVENOUS
  Filled 2023-09-05: qty 1

## 2023-09-05 MED ORDER — METOPROLOL TARTRATE 5 MG/5ML IV SOLN
5.0000 mg | Freq: Once | INTRAVENOUS | Status: AC
  Administered 2023-09-05 (×2): 5 mg via INTRAVENOUS
  Filled 2023-09-05: qty 5

## 2023-09-05 MED ORDER — DIPHENHYDRAMINE HCL 50 MG/ML IJ SOLN
25.0000 mg | Freq: Once | INTRAMUSCULAR | Status: AC
  Administered 2023-09-05 (×2): 25 mg via INTRAVENOUS
  Filled 2023-09-05: qty 1

## 2023-09-05 MED ORDER — FELBAMATE 600 MG PO TABS
1800.0000 mg | ORAL_TABLET | Freq: Two times a day (BID) | ORAL | Status: DC
  Administered 2023-09-05 (×2): 1800 mg via ORAL
  Filled 2023-09-05 (×2): qty 3

## 2023-09-05 MED ORDER — ONDANSETRON HCL 4 MG/2ML IJ SOLN: 4.0000 mg | Freq: Four times a day (QID) | INTRAMUSCULAR | Status: DC | PRN

## 2023-09-05 MED ORDER — SODIUM CHLORIDE 0.9 % IV BOLUS
1000.0000 mL | Freq: Once | INTRAVENOUS | Status: AC
  Administered 2023-09-05 (×2): 1000 mL via INTRAVENOUS

## 2023-09-05 MED ORDER — PANTOPRAZOLE SODIUM 40 MG PO TBEC
40.0000 mg | DELAYED_RELEASE_TABLET | Freq: Every day | ORAL | Status: DC
  Administered 2023-09-05 – 2023-09-06 (×4): 40 mg via ORAL
  Filled 2023-09-05 (×2): qty 1

## 2023-09-05 MED ORDER — METOPROLOL TARTRATE 25 MG PO TABS
25.0000 mg | ORAL_TABLET | Freq: Two times a day (BID) | ORAL | Status: DC
  Administered 2023-09-05 – 2023-09-06 (×4): 25 mg via ORAL
  Filled 2023-09-05 (×2): qty 1

## 2023-09-05 MED ORDER — DEXAMETHASONE SODIUM PHOSPHATE 10 MG/ML IJ SOLN
4.0000 mg | Freq: Once | INTRAMUSCULAR | Status: AC
  Administered 2023-09-05 (×2): 4 mg via INTRAVENOUS
  Filled 2023-09-05: qty 1

## 2023-09-05 MED ORDER — SODIUM CHLORIDE 0.9 % IV SOLN: INTRAVENOUS | Status: DC

## 2023-09-05 NOTE — ED Provider Notes (Addendum)
 [place] EMERGENCY DEPARTMENT AT [facility] Provider Note   CSN: [id number] Arrival date & time: 09/05/23  9085     Patient presents with: Migraine   Susan Hansen is a 63 y.o. female.   63 year old woman who presents with worsening headache.  Patient states that she has a history of chronic migraines and this has been going on for several weeks.  Pain is up to left frontal parietal region.  Unchanged from her prior migraines.  Does have some photophobia as well as phonophobia.  No fever or neck pain.  She has had nausea but no vomiting.  Denies any new focal neurological deficits.  Has been trying to get into her neurologist without relief.  Has been using Excedrin Migraine with temporary relief.  Has been seen at urgent care and was medicated there but the headache returned.       Prior to Admission medications   Medication Sig Start Date End Date Taking? Authorizing Provider  Ascorbic Acid (VITAMIN C PO) Take by mouth.    [provider]  b complex vitamins tablet Take 1 tablet by mouth daily.    [provider]  Chlorpheniramine Maleate (ALLERGY RELIEF PO) Take 1 tablet by mouth daily. Allertec from [employer], Historical, MD  Coenzyme Q10 (COQ10 PO) Take by mouth.    [provider]  felbamate  (FELBATOL ) 600 MG tablet Take 3 tablets (1,800 mg total) by mouth 3 (three) times daily. 05/12/23   Susan Onetha NOVAK, MD  Flaxseed, Linseed, (FLAX SEED OIL PO) Take 1,200 mg by mouth daily.    [provider]  LECITHIN PO Take by mouth.    [provider]  lisinopril  (ZESTRIL ) 20 MG tablet Take 20 mg by mouth daily.  09/22/18   [provider]  LORazepam  (ATIVAN ) 1 MG tablet Please take 1 tablet at onset of aura. May take an additional dose in 5-15 minutes. Max 2mg  a day. 09/23/22   Susan Onetha NOVAK, MD  MAGNESIUM PO Take by mouth. At least 400 mg daily    [provider]  Meloxicam  7.5 MG TBDP Take 7.5 mg by  mouth daily. 07/12/23   [name], MD  pantoprazole  (PROTONIX ) 40 MG tablet Take 1 tablet (40 mg total) by mouth daily. 07/12/23   [name], MD  Red Yeast Rice Extract 300 MG CAPS Take 1 tablet by mouth daily.    [provider]  rosuvastatin  (CRESTOR ) 5 MG tablet Take 5 mg by mouth daily.    [provider]    Allergies: Imodium [loperamide]    Review of Systems  All other systems reviewed and are negative.   Updated Vital Signs BP (!) 150/94 (BP Location: Right Arm)   Pulse 65   Temp 98.1 F (36.7 C) (Oral)   Resp 18   Ht 1.676 m (5' 6)   Wt 79.8 kg   SpO2 98%   BMI 28.41 kg/m   Physical Exam Vitals and nursing note reviewed.  Constitutional:      General: She is not in acute distress.    Appearance: Normal appearance. She is well-developed. She is not toxic-appearing.  HENT:     Head: Normocephalic and atraumatic.  Eyes:     General: Lids are normal.     Conjunctiva/sclera: Conjunctivae normal.     Pupils: Pupils are equal, round, and reactive to light.  Neck:     Thyroid : No thyroid  mass.     Trachea: No  tracheal deviation.  Cardiovascular:     Rate and Rhythm: Normal rate and regular rhythm.     Heart sounds: Normal heart sounds. No murmur heard.    No gallop.  Pulmonary:     Effort: Pulmonary effort is normal. No respiratory distress.     Breath sounds: Normal breath sounds. No stridor. No decreased breath sounds, wheezing, rhonchi or rales.  Abdominal:     General: There is no distension.     Palpations: Abdomen is soft.     Tenderness: There is no abdominal tenderness. There is no rebound.  Musculoskeletal:        General: No tenderness. Normal range of motion.     Cervical back: Normal range of motion and neck supple.  Skin:    General: Skin is warm and dry.     Findings: No abrasion or rash.  Neurological:     General: No focal deficit present.     Mental Status: She is alert and oriented to person, place, and  time. Mental status is at baseline.     GCS: GCS eye subscore is 4. GCS verbal subscore is 5. GCS motor subscore is 6.     Cranial Nerves: No cranial nerve deficit.     Sensory: No sensory deficit.     Motor: Motor function is intact.  Psychiatric:        Attention and Perception: Attention normal.        Speech: Speech normal.        Behavior: Behavior normal.     (all labs ordered are listed, but only abnormal results are displayed) Labs Reviewed  COMPREHENSIVE METABOLIC PANEL WITH GFR    EKG: EKG Interpretation Date/Time:  Monday September 05 2023 12:09:42 EDT Ventricular Rate:  71 PR Interval:    QRS Duration:  129 QT Interval:  558 QTC Calculation: 607 R Axis:   3  Text Interpretation: Atrial flutter with predominant 4:1 AV block Right bundle branch block Anteroseptal infarct, old Abnormal T, consider ischemia, diffuse leads ST elevation, consider inferior injury Confirmed by Dasie Faden ([id number]) on 09/05/2023 12:41:26 PM  Radiology: No results found.   Procedures   Medications Ordered in the ED  0.9 %  sodium chloride  infusion (has no administration in time range)  sodium chloride  0.9 % bolus 1,000 mL (has no administration in time range)  metoCLOPramide  (REGLAN ) injection 10 mg (has no administration in time range)  dexamethasone  (DECADRON ) injection 10 mg (has no administration in time range)  diphenhydrAMINE  (BENADRYL ) injection 12.5 mg (has no administration in time range)                                    Medical Decision Making Amount and/or Complexity of Data Reviewed Labs: ordered. Radiology: ordered. ECG/medicine tests: ordered.  Risk Prescription drug management. Decision regarding hospitalization.  Patient's migraine headache treated with steroids and Reglan  and she does feel better.  Family at bedside and concerned about that her headache is changed in quality.  Patient initially did not tell me this but then does admit that it is slightly  different.  Head CT performed and did not show any acute findings. Patient is EKG shows atrial flutter.  Patient given Lopressor  for this.  Labs significant for normal TSH.  Rate did improve but has transiently gone up.  I did discuss the case with Dr. Lavona from cardiology.  He recommended patient be started on  Lopressor  as well as Eliquis  and be seen in the A-fib/a flutter clinic.  Patient monitored here for several hours and continues to have periods of increased rate up into the 140s 150s.  I feel at this point, patient will need to be admitted for further observation.  I did give patient her first dose of Eliquis  of 5 mg.  Patient comfortable with this  CRITICAL CARE Performed by: Curtistine ONEIDA Dawn Total critical care time: 60 minutes Critical care time was exclusive of separately billable procedures and treating other patients. Critical care was necessary to treat or prevent imminent or life-threatening deterioration. Critical care was time spent personally by me on the following activities: development of treatment plan with patient and/or surrogate as well as nursing, discussions with consultants, evaluation of patient's response to treatment, examination of patient, obtaining history from patient or surrogate, ordering and performing treatments and interventions, ordering and review of laboratory studies, ordering and review of radiographic studies, pulse oximetry and re-evaluation of patient's condition.      Final diagnoses:  None    ED Discharge Orders     None          Dawn Curtistine, MD 09/05/23 1454    Dawn Curtistine, MD 09/05/23 1456

## 2023-09-05 NOTE — ED Notes (Signed)
Report given to the Floor. 

## 2023-09-05 NOTE — ED Notes (Signed)
 Provider given 5-Lead.[email or web address]

## 2023-09-05 NOTE — ED Notes (Signed)
 Report given to [facility].

## 2023-09-05 NOTE — ED Triage Notes (Signed)
 Migraine/cluster headaches onset today at 0830. Has not taken any medication today. Has been taking Excedrin migraine PRN for the last couple of weeks. Neuro appt next month. Nausea, mild photosensitivity. No vomiting.

## 2023-09-05 NOTE — ED Notes (Signed)
 Thomas with cl called for transport

## 2023-09-05 NOTE — H&P (Signed)
 History and Physical    Patient: Susan Hansen FMW:[id number] DOB: [birth year] DOA: 09/05/2023 DOS: the patient was seen and examined on 09/05/2023 PCP: Katina Pfeiffer, PA-C  Patient coming from: Home  Chief Complaint:  Chief Complaint  Patient presents with   Migraine   HPI: Susan Hansen is a 63 y.o. female with medical history significant of cluster headaches, hyperlipidemia, possible migraine headaches, possible seizure disorder, essential hypertension who was sent over from the [facility] where she went to work this morning complaining of persistent headaches.  Patient has follow-up with her neurologist on a regular basis.  She has been treated previously for the cluster headaches.  She still gets attacks irregularly.  Sometimes goes months without attacks.  In the ER she was initially treated with IV Decadron , Reglan  and other oral medications.  While in the ER she was found to have new onset atrial flutter.  Patient was started on Eliquis  and metoprolol  neuro.  Rate is controlled.  Patient was sent over for admission and observation due to this initiation of new medications for the A-flutter.  She is still complaining of headaches.  It went away but notes coming back again.  Currently is rated a 6 out of 10.  Patient appears to have rate control at the moment after the metoprolol .  Review of Systems: As mentioned in the history of present illness. All other systems reviewed and are negative. Past Medical History:  Diagnosis Date   Cluster headache    Hyperlipidemia    Hypertension    Migraines    Seizures (HCC)    Past Surgical History:  Procedure Laterality Date   BREAST BIOPSY Left 2020   CESAREAN SECTION     Social History:  reports that she has never smoked. She has never used smokeless tobacco. She reports that she does not currently use alcohol. She reports that she does not use drugs.  Allergies  Allergen Reactions   Imodium [Loperamide]     Family History   Problem Relation Age of Onset   Stroke Mother    Heart disease Mother    Diabetes Father    Hypertension Father    Heart disease Father    Breast cancer Paternal Grandmother    Seizures Neg Hx     Prior to Admission medications   Medication Sig Start Date End Date Taking? Authorizing Provider  felbamate  (FELBATOL ) 600 MG tablet Take 3 tablets (1,800 mg total) by mouth 3 (three) times daily. 05/12/23  Yes [name], MD  lisinopril  (ZESTRIL ) 20 MG tablet Take 20 mg by mouth daily.  09/22/18  Yes [provider]  Ascorbic Acid (VITAMIN C PO) Take by mouth.    [provider]  b complex vitamins tablet Take 1 tablet by mouth daily.    [provider]  Chlorpheniramine Maleate (ALLERGY RELIEF PO) Take 1 tablet by mouth daily. [employer] from [employer], Historical, MD  Coenzyme Q10 (COQ10 PO) Take by mouth.    [provider]  Flaxseed, Linseed, (FLAX SEED OIL PO) Take 1,200 mg by mouth daily.    [provider]  LECITHIN PO Take by mouth.    [provider]  LORazepam  (ATIVAN ) 1 MG tablet Please take 1 tablet at onset of aura. May take an additional dose in 5-15 minutes. Max 2mg  a day. 09/23/22   [name], MD  MAGNESIUM PO Take by mouth. At least 400 mg daily    [provider]  Meloxicam  7.5 MG TBDP Take 7.5 mg by mouth daily. 07/12/23   [name], MD  pantoprazole  (PROTONIX ) 40 MG tablet Take 1 tablet (40 mg total) by mouth daily. 07/12/23   [name], MD  Red Yeast Rice Extract 300 MG CAPS Take 1 tablet by mouth daily.    [provider]  rosuvastatin  (CRESTOR ) 5 MG tablet Take 5 mg by mouth daily.    [provider]    Physical Exam: Vitals:   09/05/23 1600 09/05/23 1700 09/05/23 1815 09/05/23 1822  BP: (!) 142/85 112/74  (!) 148/85  Pulse: (!) 141 92  (!) 119  Resp: 12 12  20   Temp:    99.5 F (37.5 C)  TempSrc:    Oral  SpO2: 98% 94%  95%  Weight:   76.4 kg    Height:   5' 6 (1.676 m)    Constitutional: Acutely ill looking with mild distress due to headaches,  Eyes: PERRL, lids and conjunctivae normal ENMT: Mucous membranes are moist. Posterior pharynx clear of any exudate or lesions.Normal dentition.  Neck: normal, supple, no masses, no thyromegaly Respiratory: clear to auscultation bilaterally, no wheezing, no crackles. Normal respiratory effort. No accessory muscle use.  Cardiovascular: Irregularly irregular with tachycardia, no murmurs / rubs / gallops. No extremity edema. 2+ pedal pulses. No carotid bruits.  Abdomen: no tenderness, no masses palpated. No hepatosplenomegaly. Bowel sounds positive.  Musculoskeletal: Good range of motion, no joint swelling or tenderness, Skin: no rashes, lesions, ulcers. No induration Neurologic: CN 2-12 grossly intact. Sensation intact, DTR normal. Strength 5/5 in all 4.  Psychiatric: Normal judgment and insight. Alert and oriented x 3.  Anxious mood  Data Reviewed:  Temperature 99.5, blood pressure 150/94, pulse 143, respiratory rate 20 oxygen sat 94% on room air.  CBC and chemistry largely within normal.  TSH 0.929.  Head CT without contrast showed no acute findings.  EKG showed atrial a flutter with a rate of 140s  Assessment and Plan:  #1 new onset atrial flutter: Suspected related to her ongoing headaches with stressors.  Patient will be admitted for observation.  Initiate on oral metoprolol  as well as oral Eliquis .  Will get echocardiogram in the morning.  Initiate IV pushes.  Cardiology was consulted by the ER and will likely see patient prior to discharge  #2 persistent cluster headaches: Possibly migraine coagulant.  I will give patient the headache cocktail which will include Decadron , Toradol , with Zofran .  Monitor response.  Will also resume chronic home regimen.  #3 essential hypertension: Blood pressure appears mildly elevated.  Continue current regimen  #4 partial epilepsy: Continue home  regimen  #5 hyperlipidemia: Will confirm on resume home regimen    Advance Care Planning:   Code Status: Not on file full code  Consults: Possible cardiology consult in the morning  Family Communication: Husband at bedside  Severity of Illness: The appropriate patient status for this patient is OBSERVATION. Observation status is judged to be reasonable and necessary in order to provide the required intensity of service to ensure the patient's safety. The patient's presenting symptoms, physical exam findings, and initial radiographic and laboratory data in the context of their medical condition is felt to place them at decreased risk for further clinical deterioration. Furthermore, it is anticipated that the patient will be medically stable for discharge from the hospital within 2 midnights of admission.   AuthorBETHA SIM KNOLL, MD 09/05/2023 6:49 PM  For on call review [email or web address].

## 2023-09-05 NOTE — Progress Notes (Signed)
 Plan of Care Note for accepted transfer   Patient: Susan Hansen MRN: [id number]   DOA: 09/05/2023  Facility requesting transfer: [facility] Requesting Provider: [facility] Reason for transfer: new onset atrial flutter  Facility course: 63yo with h/o HTN, HLD, headaches, and seizure d/o who presented on 8/11 with a migraine, chronic, difficulty getting in with neurology.  CT ok.  She was treated with steroids and Reglan  with some improvement but was found to be in atrial flutter.   Uncertain when this started.  HR 70-150s.  Discussed with cardiology, Dr. Lavona.  He recommended Lopressor  and Eliquis .  Still with headache, may be contributing.  Reasonable to watch overnight given recurrent elevation in HR.  Will observe until time of transfer, could dc sooner if stabilizes.   Plan of care: The patient is accepted for admission to Telemetry unit, at [facility]..    Author: Delon Herald, MD 09/05/2023  Check [email or web address] for on-call coverage.  Nursing staff, Please call [facility] Admits & Consults System-Wide number on Amion as soon as patient's arrival, so appropriate admitting provider can evaluate the pt.

## 2023-09-06 ENCOUNTER — Other Ambulatory Visit (HOSPITAL_COMMUNITY): Payer: Self-pay

## 2023-09-06 ENCOUNTER — Observation Stay (HOSPITAL_BASED_OUTPATIENT_CLINIC_OR_DEPARTMENT_OTHER)

## 2023-09-06 ENCOUNTER — Telehealth (HOSPITAL_COMMUNITY): Payer: Self-pay | Admitting: Pharmacy Technician

## 2023-09-06 DIAGNOSIS — G44019 Episodic cluster headache, not intractable: Secondary | ICD-10-CM

## 2023-09-06 DIAGNOSIS — I4892 Unspecified atrial flutter: Secondary | ICD-10-CM

## 2023-09-06 DIAGNOSIS — G40219 Localization-related (focal) (partial) symptomatic epilepsy and epileptic syndromes with complex partial seizures, intractable, without status epilepticus: Secondary | ICD-10-CM | POA: Diagnosis not present

## 2023-09-06 LAB — LIPID PANEL
Cholesterol: 251 mg/dL — ABNORMAL HIGH (ref 0–200)
HDL: 66 mg/dL (ref >40)
LDL Cholesterol: 165 mg/dL — ABNORMAL HIGH (ref 0–99)
Total CHOL/HDL Ratio: 3.8 ratio
Triglycerides: 102 mg/dL (ref <150)
VLDL: 20 mg/dL (ref 0–40)

## 2023-09-06 LAB — ECHOCARDIOGRAM COMPLETE
Area-P 1/2: 2.54 cm2
Calc EF: 64.8 %
Height: 66 in
S' Lateral: 2.56 cm
Single Plane A2C EF: 60.6 %
Single Plane A4C EF: 68.3 %
Weight: 2733.7 [oz_av]

## 2023-09-06 LAB — HIV ANTIBODY (ROUTINE TESTING W REFLEX): HIV Screen 4th Generation wRfx: NONREACTIVE

## 2023-09-06 MED ORDER — DIPHENHYDRAMINE HCL 50 MG/ML IJ SOLN
25.0000 mg | Freq: Once | INTRAMUSCULAR | Status: AC
  Administered 2023-09-06 (×2): 25 mg via INTRAVENOUS
  Filled 2023-09-06: qty 1

## 2023-09-06 MED ORDER — METOPROLOL TARTRATE 25 MG PO TABS
25.0000 mg | ORAL_TABLET | Freq: Two times a day (BID) | ORAL | 0 refills | Status: DC
  Filled 2023-09-06: qty 60, 30d supply, fill #0

## 2023-09-06 MED ORDER — ALUM & MAG HYDROXIDE-SIMETH 200-200-20 MG/5ML PO SUSP: 10.0000 mL | Freq: Four times a day (QID) | ORAL | Status: DC | PRN

## 2023-09-06 MED ORDER — NAPROXEN 500 MG PO TABS
500.0000 mg | ORAL_TABLET | Freq: Two times a day (BID) | ORAL | 2 refills | Status: DC | PRN
  Filled 2023-09-06: qty 30, 15d supply, fill #0

## 2023-09-06 MED ORDER — DEXAMETHASONE 4 MG PO TABS
4.0000 mg | ORAL_TABLET | Freq: Every day | ORAL | 0 refills | Status: DC | PRN
  Filled 2023-09-06: qty 5, 5d supply, fill #0

## 2023-09-06 MED ORDER — B COMPLEX-C PO TABS
1.0000 | ORAL_TABLET | Freq: Every day | ORAL | Status: DC
  Administered 2023-09-06 (×2): 1 via ORAL
  Filled 2023-09-06: qty 1

## 2023-09-06 MED ORDER — FELBAMATE 600 MG PO TABS
1800.0000 mg | ORAL_TABLET | Freq: Two times a day (BID) | ORAL | Status: DC
  Administered 2023-09-06 (×2): 1800 mg via ORAL
  Filled 2023-09-06 (×3): qty 3

## 2023-09-06 MED ORDER — KETOROLAC TROMETHAMINE 15 MG/ML IJ SOLN
15.0000 mg | Freq: Once | INTRAMUSCULAR | Status: AC
  Administered 2023-09-06 (×2): 15 mg via INTRAVENOUS
  Filled 2023-09-06: qty 1

## 2023-09-06 MED ORDER — LISINOPRIL 20 MG PO TABS
20.0000 mg | ORAL_TABLET | Freq: Every day | ORAL | Status: DC
  Administered 2023-09-06 (×2): 20 mg via ORAL
  Filled 2023-09-06: qty 1

## 2023-09-06 MED ORDER — DIPHENHYDRAMINE HCL 50 MG PO TABS
50.0000 mg | ORAL_TABLET | Freq: Every day | ORAL | 0 refills | Status: DC | PRN
  Filled 2023-09-06: qty 5, 5d supply, fill #0

## 2023-09-06 MED ORDER — METOPROLOL TARTRATE 5 MG/5ML IV SOLN: 5.0000 mg | INTRAVENOUS | Status: DC | PRN

## 2023-09-06 MED ORDER — APIXABAN 5 MG PO TABS
5.0000 mg | ORAL_TABLET | Freq: Two times a day (BID) | ORAL | 0 refills | Status: DC
  Filled 2023-09-06: qty 60, 30d supply, fill #0

## 2023-09-06 MED ORDER — LISINOPRIL-HYDROCHLOROTHIAZIDE 20-12.5 MG PO TABS: 1.0000 | ORAL_TABLET | Freq: Every day | ORAL | Status: DC

## 2023-09-06 MED ORDER — HYDROCHLOROTHIAZIDE 12.5 MG PO TABS
12.5000 mg | ORAL_TABLET | Freq: Every day | ORAL | Status: DC
  Administered 2023-09-06 (×2): 12.5 mg via ORAL
  Filled 2023-09-06: qty 1

## 2023-09-06 MED ORDER — DEXAMETHASONE SODIUM PHOSPHATE 10 MG/ML IJ SOLN
4.0000 mg | Freq: Once | INTRAMUSCULAR | Status: AC
Start: 2023-09-06 — End: 2023-09-06
  Administered 2023-09-06 (×2): 4 mg via INTRAVENOUS
  Filled 2023-09-06: qty 1

## 2023-09-06 MED ORDER — COQ10 100 MG PO CAPS: 1.0000 | ORAL_CAPSULE | Freq: Every day | ORAL | Status: DC

## 2023-09-06 MED ORDER — RED YEAST RICE EXTRACT 300 MG PO CAPS: 1.0000 | ORAL_CAPSULE | Freq: Every day | ORAL | Status: DC

## 2023-09-06 MED ORDER — FELBAMATE 600 MG PO TABS: 1800.0000 mg | ORAL_TABLET | Freq: Three times a day (TID) | ORAL | Status: DC

## 2023-09-06 NOTE — Hospital Course (Signed)
 63 year old female with a history of cluster headaches, partial epilepsy on AEM, HLD, presenting from [facility] with worsening persistent headaches, found to have new onset A-fib.   Assessment and Plan:   Paroxysmal atrial fibrillation/flutter, new diagnosis - Etiology unclear.  Patient with family history of heart disease and A-fib.  TSH normal.  No concern for PE.  No new medications.  Possibly exacerbated by headaches.  Echo pending.  Initial consult in ER noting recommendations to initiate metoprolol  plus Eliquis  and have patient follow with a fluid clinic in the outpatient setting.  Anticipate discharge later today after echo.   Cluster headaches - Patient with a longstanding history of cluster headaches and migraine, followed by neurology in the outpatient setting.  Given cocktail of Decadron , Toradol , Benadryl  with mild improvement.  Will continue to monitor for now.  Patient has been resistant to many different medication regimens and has not found any adequate abortive therapy in her many years of having this diagnosis.   Hypertension - Likely exacerbated by pain.  Monitor closely.   Partial epilepsy - Resume home AEM regiment

## 2023-09-06 NOTE — TOC Transition Note (Signed)
 Transition of Care [facility]) - Discharge Note   Patient Details  Name: Susan Hansen MRN: [id number] Date of Birth: [birth year]  [facility] CM/SW Contact:  Waddell Barnie Rama, RN Phone Number: 09/06/2023, 4:08 PM   Clinical Narrative:    Possible dc today or tomorrow, she has no needs.          Patient Goals and CMS Choice            Discharge Placement                       Discharge Plan and Services Additional resources added to the After Visit Summary for                                       Social Drivers of Health (SDOH) Interventions SDOH Screenings   Food Insecurity: No Food Insecurity (09/05/2023)  Housing: Low Risk  (09/05/2023)  Transportation Needs: No Transportation Needs (09/05/2023)  Utilities: Not At Risk (09/05/2023)  Social Connections: Moderately Isolated (09/05/2023)  Tobacco Use: Low Risk  (09/05/2023)     Readmission Risk Interventions     No data to display

## 2023-09-06 NOTE — Progress Notes (Signed)
 Progress Note   Patient: Susan Hansen FMW:[id number] DOB: [birth year] DOA: 09/05/2023  DOS: the patient was seen and examined on 09/06/2023   Brief hospital course:  63 year old female with a history of cluster headaches, partial epilepsy on AEM, HLD, presenting from [facility] with worsening persistent headaches, found to have new onset A-fib.  Assessment and Plan:  Paroxysmal atrial fibrillation/flutter, new diagnosis - Etiology unclear.  Patient with family history of heart disease and A-fib.  TSH normal.  No concern for PE.  No new medications.  Possibly exacerbated by headaches.  Echo pending.  Initial consult in ER noting recommendations to initiate metoprolol  plus Eliquis  and have patient follow with a fluid clinic in the outpatient setting.  Anticipate discharge later today after echo.  Cluster headaches - Patient with a longstanding history of cluster headaches and migraine, followed by neurology in the outpatient setting.  Given cocktail of Decadron , Toradol , Benadryl  with mild improvement.  Will continue to monitor for now.  Patient has been resistant to many different medication regimens and has not found any adequate abortive therapy in her many years of having this diagnosis.  Hypertension - Likely exacerbated by pain.  Monitor closely.  Partial epilepsy - Resume home AEM regiment    Subjective: Patient resting comfortably this morning.  States her headache pain is improved but definitely has anxiety and fears of the pain coming back.  Is never noticed any palpitations but does admit to family history of cardiac disease and A-fib.  No chest pain, fevers, chills, nausea, vomiting, abdominal pain.  Physical Exam:  Vitals:   09/06/23 0748 09/06/23 0855 09/06/23 1013 09/06/23 1150  BP: 132/88 135/87  99/64  Pulse: (!) 147  91 86  Resp: 18 18  18   Temp:  98 F (36.7 C)  99 F (37.2 C)  TempSrc:  Oral  Oral  SpO2:  100%  94%  Weight:      Height:        GENERAL:   Alert, pleasant, no acute distress  HEENT:  EOMI CARDIOVASCULAR: Irregularly irregular, no murmurs appreciated RESPIRATORY:  Clear to auscultation, no wheezing, rales, or rhonchi GASTROINTESTINAL:  Soft, nontender, nondistended EXTREMITIES:  No LE edema bilaterally NEURO:  No new focal deficits appreciated SKIN:  No rashes noted PSYCH: Anxious    Data Reviewed:  Imaging Studies: CT Head Wo Contrast Result Date: 09/05/2023 EXAM: CT HEAD WITHOUT CONTRAST 09/05/2023 01:35:53 PM TECHNIQUE: CT of the head was performed without the administration of intravenous contrast. Automated exposure control, iterative reconstruction, and/or weight based adjustment of the mA/kV was utilized to reduce the radiation dose to as low as reasonably achievable. COMPARISON: 09/08/2008 CLINICAL HISTORY: Headache, increasing frequency or severity. Migraine/cluster headaches onset today at 0830. Has not taken any medication today. Nausea, mild photosensitivity. No vomiting. FINDINGS: BRAIN AND VENTRICLES: No acute hemorrhage. Gray-white differentiation is preserved. No hydrocephalus. No extra-axial collection. No mass effect or midline shift. ORBITS: No acute abnormality. SINUSES: No acute abnormality. SOFT TISSUES AND SKULL: No acute soft tissue abnormality. No skull fracture. IMPRESSION: 1. No acute intracranial abnormality. Electronically signed by: Donnice Mania MD 09/05/2023 02:16 PM EDT RP Workstation: [id number]    Results are pending, will review when available.  Previous records (including but not limited to H&P, progress notes, nursing notes, TOC management) were reviewed in assessment of this patient.  Labs: CBC: Recent Labs  Lab 09/05/23 1039  WBC 4.4  NEUTROABS 3.3  HGB 13.9  HCT 41.6  MCV 94.1  PLT 191  Basic Metabolic Panel: Recent Labs  Lab 09/05/23 1018  NA 138  K 3.6  CL 100  CO2 24  GLUCOSE 113*  BUN 17  CREATININE 0.76  CALCIUM  9.5   Liver Function Tests: Recent Labs  Lab  09/05/23 1018  AST 25  ALT 18  ALKPHOS 72  BILITOT 0.4  PROT 7.1  ALBUMIN 4.2   CBG: No results for input(s): GLUCAP in the last 168 hours.  Scheduled Meds:  apixaban   5 mg Oral BID   B-complex with vitamin C  1 tablet Oral Daily   felbamate   1,800 mg Oral BID   hydrochlorothiazide   12.5 mg Oral Daily   lisinopril   20 mg Oral Daily   metoprolol  tartrate  25 mg Oral BID   pantoprazole   40 mg Oral Daily   rosuvastatin   5 mg Oral Daily   Continuous Infusions:  sodium chloride  125 mL/hr at 09/06/23 1305   PRN Meds:.acetaminophen , alum & mag hydroxide-simeth, LORazepam , metoprolol  tartrate, ondansetron  (ZOFRAN ) IV  Family Communication: Family at bedside  Disposition: Status is: Observation The patient remains OBS appropriate and will d/c before 2 midnights.     Time spent: 36 minutes  Length of inpatient stay: 0 days  Author: Carliss LELON Canales, DO 09/06/2023 1:31 PM  For on call review [email or web address].

## 2023-09-06 NOTE — Discharge Instructions (Signed)

## 2023-09-06 NOTE — Care Management Obs Status (Signed)
 MEDICARE OBSERVATION STATUS NOTIFICATION   Patient Details  Name: Susan Hansen MRN: [id number] Date of Birth: May 06, 1960   Medicare Observation Status Notification Given:  Yes    Vonzell Arrie Sharps 09/06/2023, 9:43 AM

## 2023-09-06 NOTE — Discharge Summary (Addendum)
 Physician Discharge Summary   Patient: Susan Hansen MRN: [id number] DOB: [birth year]  Admit date:     09/05/2023  Discharge date: 09/06/23  Discharge Physician: Carliss LELON Canales   PCP: Katina Pfeiffer, PA-C   Recommendations at discharge:    Pt to be discharged home.   If you experience worsening fever, chills, chest pain, shortness of breath, or other concerning symptoms, please call your PCP or go to the emergency department immediately.  Discharge Diagnoses: Principal Problem:   New onset atrial flutter (HCC) Active Problems:   Partial epilepsy with impairment of consciousness, with intractable epilepsy (HCC)   Episodic cluster headache  Resolved Problems:   * No resolved hospital problems. *   Hospital Course:  63 year old female with a history of cluster headaches, partial epilepsy on AEM, HLD, presenting from [facility] with worsening persistent headaches, found to have new onset A-fib.   Assessment and Plan:   Paroxysmal atrial fibrillation/flutter, new diagnosis - Etiology unclear.  Patient with family history of heart disease and A-fib.  TSH normal.  No concern for PE.  No new medications.  Possibly exacerbated by headaches.  Echo showing no wall motion abnormalities, normal EF.  Initial consult in ER noting recommendations to initiate metoprolol  plus Eliquis  and have patient follow with afib clinic in the outpatient setting.   Prescription provided for metoprolol  and Eliquis .  Cluster headaches - Patient with a longstanding history of cluster headaches and migraine, followed by neurology in the outpatient setting.  Given cocktail of Decadron , Toradol , Benadryl  with mild improvement.  Patient has been resistant to many different medication regimens and has not found any adequate abortive therapy in her many years of having this diagnosis.  Would recommend patient follow-up with her neurologist in the next week.   Hypertension - Likely exacerbated by pain.  Resume home  regimen upon discharge.   Partial epilepsy - Resume home AEM regiment   Consultants: None Procedures performed: None Disposition: Home Diet recommendation:  Discharge Diet Orders (From admission, onward)     Start     Ordered   09/06/23 0000  Diet - low sodium heart healthy        09/06/23 1647           Cardiac and Carb modified diet  DISCHARGE MEDICATION: Allergies as of 09/06/2023       Reactions   Imodium [loperamide]         Medication List     TAKE these medications    ALLERGY RELIEF PO Take 1 tablet by mouth daily. Allertec from [employer]   aluminum-magnesium hydroxide 200-200 MG/5ML suspension Take 10 mLs by mouth every 6 (six) hours as needed for indigestion.   apixaban  5 MG Tabs tablet Commonly known as: ELIQUIS  Take 1 tablet (5 mg total) by mouth 2 (two) times daily.   b complex vitamins tablet Take 1 tablet by mouth daily.   CoQ10 100 MG Caps Take 1 tablet by mouth daily.   dexamethasone  4 MG tablet Commonly known as: DECADRON  Take 1 tablet (4 mg total) by mouth daily as needed (headache).   diphenhydrAMINE  50 MG tablet Commonly known as: BENADRYL  Take 1 tablet (50 mg total) by mouth daily as needed (headache).   felbamate  600 MG tablet Commonly known as: FELBATOL  Take 3 tablets (1,800 mg total) by mouth 3 (three) times daily. What changed: when to take this   FLAX SEED OIL PO Take 1,200 mg by mouth daily.   LECITHIN PO Take 1 tablet by mouth daily.  lisinopril -hydrochlorothiazide  20-12.5 MG tablet Commonly known as: ZESTORETIC  Take 1 tablet by mouth daily.   LORazepam  1 MG tablet Commonly known as: Ativan  Please take 1 tablet at onset of aura. May take an additional dose in 5-15 minutes. Max 2mg  a day.   MAGNESIUM PO Take by mouth. At least 400 mg daily   MELATONIN GUMMIES PO Take 2 each by mouth at bedtime.   metoprolol  tartrate 25 MG tablet Commonly known as: LOPRESSOR  Take 1 tablet (25 mg total) by mouth 2 (two)  times daily.   naproxen  500 MG tablet Commonly known as: Naprosyn  Take 1 tablet (500 mg total) by mouth 2 (two) times daily as needed for headache.   pantoprazole  40 MG tablet Commonly known as: PROTONIX  Take 1 tablet (40 mg total) by mouth daily.   Red Yeast Rice Extract 300 MG Caps Take 1 tablet by mouth daily.   rosuvastatin  5 MG tablet Commonly known as: CRESTOR  Take 5 mg by mouth daily.   VITAMIN C PO Take 1 tablet by mouth daily.         Discharge Exam: Filed Weights   09/05/23 9075 09/05/23 1815 09/06/23 0522  Weight: 79.8 kg 76.4 kg 77.5 kg    GENERAL:  Alert, pleasant, no acute distress  HEENT:  EOMI CARDIOVASCULAR: Irregularly irregular, no murmurs appreciated RESPIRATORY:  Clear to auscultation, no wheezing, rales, or rhonchi GASTROINTESTINAL:  Soft, nontender, nondistended EXTREMITIES:  No LE edema bilaterally NEURO:  No new focal deficits appreciated SKIN:  No rashes noted PSYCH: Anxious    Condition at discharge: improving  The results of significant diagnostics from this hospitalization (including imaging, microbiology, ancillary and laboratory) are listed below for reference.   Imaging Studies: ECHOCARDIOGRAM COMPLETE Result Date: 09/06/2023    ECHOCARDIOGRAM REPORT   Patient Name:   Susan Hansen Date of Exam: 09/06/2023 Medical Rec #:  [id number]   Height:       66.0 in Accession #:    [id number]  Weight:       170.9 lb Date of Birth:  1960-09-19   BSA:          1.870 m Patient Age:    63 years    BP:           99/64 mmHg Patient Gender: F           HR:           74 bpm. Exam Location:  Inpatient Procedure: 2D Echo, Cardiac Doppler and Color Doppler (Both Spectral and Color            Flow Doppler were utilized during procedure). Indications:    I48.92* Unspecified atrial flutter  History:        Patient has prior history of Echocardiogram examinations, most                 recent 09/29/2018. Abnormal ECG, Arrythmias:Atrial Flutter; Risk                  Factors:Hypertension and Dyslipidemia.  Sonographer:    Ellouise Mose RDCS Referring Phys: [id number] MOHAMMAD L GARBA IMPRESSIONS  1. Left ventricular ejection fraction, by estimation, is 60 to 65%. The left ventricle has normal function. The left ventricle has no regional wall motion abnormalities. Left ventricular diastolic parameters are indeterminate.  2. Right ventricular systolic function is low normal. The right ventricular size is mildly enlarged. There is normal pulmonary artery systolic pressure. The estimated right ventricular systolic pressure is 18.1 mmHg.  3. The  mitral valve is grossly normal. Mild mitral valve regurgitation. No evidence of mitral stenosis.  4. Focal calcification noted on the NCC. The aortic valve is tricuspid. There is mild calcification of the aortic valve. There is mild thickening of the aortic valve. Aortic valve regurgitation is not visualized. Aortic valve sclerosis is present, with no evidence of aortic valve stenosis.  5. The inferior vena cava is dilated in size with >50% respiratory variability, suggesting right atrial pressure of 8 mmHg. Comparison(s): No significant change from prior study. FINDINGS  Left Ventricle: Left ventricular ejection fraction, by estimation, is 60 to 65%. The left ventricle has normal function. The left ventricle has no regional wall motion abnormalities. The left ventricular internal cavity size was normal in size. There is  no left ventricular hypertrophy. Left ventricular diastolic parameters are indeterminate. Right Ventricle: The right ventricular size is mildly enlarged. No increase in right ventricular wall thickness. Right ventricular systolic function is low normal. There is normal pulmonary artery systolic pressure. The tricuspid regurgitant velocity is 1.59 m/s, and with an assumed right atrial pressure of 8 mmHg, the estimated right ventricular systolic pressure is 18.1 mmHg. Left Atrium: Left atrial size was normal in size. Right Atrium: Right  atrial size was normal in size. Pericardium: There is no evidence of pericardial effusion. Presence of epicardial fat layer. Mitral Valve: The mitral valve is grossly normal. Mild mitral valve regurgitation. No evidence of mitral valve stenosis. Tricuspid Valve: The tricuspid valve is grossly normal. Tricuspid valve regurgitation is mild . No evidence of tricuspid stenosis. Aortic Valve: Focal calcification noted on the NCC. The aortic valve is tricuspid. There is mild calcification of the aortic valve. There is mild thickening of the aortic valve. Aortic valve regurgitation is not visualized. Aortic valve sclerosis is present, with no evidence of aortic valve stenosis. Pulmonic Valve: The pulmonic valve was grossly normal. Pulmonic valve regurgitation is not visualized. No evidence of pulmonic stenosis. Aorta: The aortic root and ascending aorta are structurally normal, with no evidence of dilitation. Venous: The inferior vena cava is dilated in size with greater than 50% respiratory variability, suggesting right atrial pressure of 8 mmHg. IAS/Shunts: No atrial level shunt detected by color flow Doppler.  LEFT VENTRICLE PLAX 2D LVIDd:         3.90 cm LVIDs:         2.56 cm LV PW:         1.21 cm LV IVS:        1.11 cm LVOT diam:     2.04 cm LV SV:         54 LV SV Index:   29 LVOT Area:     3.27 cm  LV Volumes (MOD) LV vol d, MOD A2C: 85.0 ml LV vol d, MOD A4C: 86.2 ml LV vol s, MOD A2C: 33.5 ml LV vol s, MOD A4C: 27.3 ml LV SV MOD A2C:     51.5 ml LV SV MOD A4C:     86.2 ml LV SV MOD BP:      56.1 ml RIGHT VENTRICLE            IVC RV S prime:     9.14 cm/s  IVC diam: 2.14 cm TAPSE (M-mode): 1.8 cm LEFT ATRIUM             Index        RIGHT ATRIUM           Index LA diam:  3.45 cm 1.84 cm/m   RA Area:     19.20 cm LA Vol (A2C):   45.6 ml 24.38 ml/m  RA Volume:   61.10 ml  32.67 ml/m LA Vol (A4C):   38.1 ml 20.37 ml/m LA Biplane Vol: 41.8 ml 22.35 ml/m  AORTIC VALVE LVOT Vmax:   94.00 cm/s LVOT Vmean:   60.900 cm/s LVOT VTI:    0.166 m  AORTA Ao Root diam: 3.04 cm Ao Asc diam:  3.21 cm MITRAL VALVE               TRICUSPID VALVE MV Area (PHT): 2.54 cm    TR Peak grad:   10.1 mmHg MV Decel Time: 299 msec    TR Vmax:        159.00 cm/s MV E velocity: 75.80 cm/s                            SHUNTS                            Systemic VTI:  0.17 m                            Systemic Diam: 2.04 cm Darryle Decent MD Electronically signed by Darryle Decent MD Signature Date/Time: 09/06/2023/3:25:32 PM    Final    CT Head Wo Contrast Result Date: 09/05/2023 EXAM: CT HEAD WITHOUT CONTRAST 09/05/2023 01:35:53 PM TECHNIQUE: CT of the head was performed without the administration of intravenous contrast. Automated exposure control, iterative reconstruction, and/or weight based adjustment of the mA/kV was utilized to reduce the radiation dose to as low as reasonably achievable. COMPARISON: 09/08/2008 CLINICAL HISTORY: Headache, increasing frequency or severity. Migraine/cluster headaches onset today at 0830. Has not taken any medication today. Nausea, mild photosensitivity. No vomiting. FINDINGS: BRAIN AND VENTRICLES: No acute hemorrhage. Gray-white differentiation is preserved. No hydrocephalus. No extra-axial collection. No mass effect or midline shift. ORBITS: No acute abnormality. SINUSES: No acute abnormality. SOFT TISSUES AND SKULL: No acute soft tissue abnormality. No skull fracture. IMPRESSION: 1. No acute intracranial abnormality. Electronically signed by: Donnice Mania MD 09/05/2023 02:16 PM EDT RP Workstation: [id number]    Microbiology: Results for orders placed or performed during the hospital encounter of 09/26/18  SARS CORONAVIRUS 2 (TAT 6-24 HRS) Nasopharyngeal Nasopharyngeal Swab     Status: None   Collection Time: 09/26/18 10:26 AM   Specimen: Nasopharyngeal Swab  Result Value Ref Range Status   SARS Coronavirus 2 NEGATIVE NEGATIVE Final    Comment: (NOTE) SARS-CoV-2 target nucleic acids are NOT  DETECTED. The SARS-CoV-2 RNA is generally detectable in upper and lower respiratory specimens during the acute phase of infection. Negative results do not preclude SARS-CoV-2 infection, do not rule out co-infections with other pathogens, and should not be used as the sole basis for treatment or other patient management decisions. Negative results must be combined with clinical observations, patient history, and epidemiological information. The expected result is Negative. Fact Sheet for Patients: HairSlick.no Fact Sheet for Healthcare Providers: quierodirigir.com This test is not yet approved or cleared by the [place]  FDA and  has been authorized for detection and/or diagnosis of SARS-CoV-2 by FDA under an Emergency Use Authorization (EUA). This EUA will remain  in effect (meaning this test can be used) for the duration of the COVID-19 declaration under Section 56 4(b)(1) of the Act,  21 U.S.C. section 360bbb-3(b)(1), unless the authorization is terminated or revoked sooner. Performed at [facility], [address] [place], [place] [address]     Labs: CBC: Recent Labs  Lab 09/05/23 1039  WBC 4.4  NEUTROABS 3.3  HGB 13.9  HCT 41.6  MCV 94.1  PLT 191   Basic Metabolic Panel: Recent Labs  Lab 09/05/23 1018  NA 138  K 3.6  CL 100  CO2 24  GLUCOSE 113*  BUN 17  CREATININE 0.76  CALCIUM  9.5   Liver Function Tests: Recent Labs  Lab 09/05/23 1018  AST 25  ALT 18  ALKPHOS 72  BILITOT 0.4  PROT 7.1  ALBUMIN 4.2   CBG: No results for input(s): GLUCAP in the last 168 hours.  Discharge time spent: 38 minutes.  Length of inpatient stay: 0 days  Signed: Carliss LELON Canales, DO [facility] 09/06/2023

## 2023-09-06 NOTE — Telephone Encounter (Signed)
 Patient Product/process development scientist completed.    The patient is insured through [employer]. Patient has Medicare and is not eligible for a copay card, but may be able to apply for patient assistance or Medicare RX Payment Plan (Patient Must reach out to their plan, if eligible for payment plan), if available.    Ran test claim for Eliquis 5 mg and the current 30 day co-pay is $152.71.   This test claim was processed through [facility]- copay amounts may vary at other pharmacies due to pharmacy/plan contracts, or as the patient moves through the different stages of their insurance plan.     Roland Earl, CPHT Pharmacy Technician III Certified Patient Advocate [facility] Patient Advocate Team Direct Number: [phone]  Fax: [phone]

## 2023-09-06 NOTE — Progress Notes (Signed)
  Echocardiogram 2D Echocardiogram has been performed.  Devora Ellouise SAUNDERS 09/06/2023, 3:02 PM

## 2023-09-06 NOTE — TOC CM/SW Note (Signed)
 Transition of Care [facility]) - Inpatient Brief Assessment   Patient Details  Name: Susan Hansen MRN: [id number] Date of Birth: [birth year]  [facility] CM/SW Contact:    Waddell Barnie Rama, RN Phone Number: 09/06/2023, 4:07 PM   Clinical Narrative: From home with spouse, has PCP and insurance on file, states has no HH services in place at this time or DME at home.  States family member (spouse)  will transport them home at [employer] and family is support system, states gets medications from [facility] on [address].  Pta self ambulatory.   There are no ICM needs identified  at this time.  Please place consult for ICM  needs.     Transition of Care Asessment: Insurance and Status: Insurance coverage has been reviewed Patient has primary care physician: Yes Home environment has been reviewed: home with spouse Prior level of function:: indep Prior/Current Home Services: No current home services Social Drivers of Health Review: SDOH reviewed no interventions necessary Readmission risk has been reviewed: Yes Transition of care needs: no transition of care needs at this time

## 2023-09-07 ENCOUNTER — Encounter: Payer: Self-pay | Admitting: Neurology

## 2023-09-07 ENCOUNTER — Other Ambulatory Visit: Payer: Self-pay | Admitting: Neurology

## 2023-09-07 DIAGNOSIS — G44019 Episodic cluster headache, not intractable: Secondary | ICD-10-CM

## 2023-09-07 MED ORDER — PREDNISONE 20 MG PO TABS: ORAL_TABLET | ORAL | 1 refills | Status: DC

## 2023-09-07 NOTE — Telephone Encounter (Signed)
 I called pt and relayed the message from Dr. Ines. She did take dexamethasone  4mg  tablet today but will stop.  Would like to get the prednisone  taper (I did relay instructions).  [facility] on [address].  She is not diabetic.  She does have afib now and is on Eliquis .  She so appreciated this.  She will start in am.  I told her she may be gone now but will send to her and hopefully she will be able to send in.

## 2023-09-07 NOTE — Telephone Encounter (Signed)
 Please see the MyChart message reply(ies) for my assessment and plan.    This patient gave consent for this Medical Advice Message and is aware that it may result in a bill to [employer], as well as the possibility of receiving a bill for a co-payment or deductible. They are an established patient, but are not seeking medical advice exclusively about a problem treated during an in person or video visit in the last seven days. I did not recommend an in person or video visit within seven days of my reply.    I spent a total of 11 minutes cumulative time within 7 days through [employer].  Onetha KATHEE Epp, MD     Thank you for your message seeking medical advice.* My assessment and recommendation are as follows:  Safa, called in the steroids, you can take your first dose tonight (its ok if you took the other pill today, just stop it). This is a high dose steroid taper, quickest fix for cluster headache recurrence. Should get you through until you see me in a few weeks!! Andree Epp  Sincerely,  Onetha KATHEE Epp, MD    *This exchange required the expertise of a doctor, nurse practitioner, physician assistant, optometrist or certified nurse midwife and qualifies as a Medical Advice Message, please visit [email or web address] for more details. [place] will bill your insurance on your behalf; copays and deductibles may apply. Questions? Reply to this message.

## 2023-09-07 NOTE — Addendum Note (Signed)
 Addended by: NEYSA NENA RAMAN on: 09/07/2023 05:30 PM   Modules accepted: Orders

## 2023-09-07 NOTE — Telephone Encounter (Signed)
 For onset of cluster headaches I usually start the following below. But appears she is on dexamethasone . If she can stop the dexamethasone  I can start this prednisone  taper:  Take 100 mg (5 tablets) for 2 days, then take 80 mg (4 tablets) for 2 days, then take 60 mg (3 tablets) for 2 days, and take 40 mg (2 tablets) for 2 days then take 20 mg (1 tablet) for 2 days, then take 10 mg (half tablet) for 2 days then stop.  Take pills in the morning with food.

## 2023-09-07 NOTE — Telephone Encounter (Signed)
 Pt last seen 09-23-2022 for epilepsy, has hx cluster headaches.  Noted in 07/2020 note Likely she has been in remission, it usually about this time that they happen but she has not had a cluster. I discussed if she does to give us  a call and we will start high-dose steroids and probably Emgality.  Appt 09-29-2023 at 1500.

## 2023-09-07 NOTE — Progress Notes (Signed)
 Cluster: Start a high-dose steroid taper on start of cluster: please keep on hand explained it has to be high dose over 2 weeks for cluster headaches and the risks and side effects with agreement and understanding form patient. This is the quickest way to treat episodic cluster cycles at onset. Take 100 mg (5 tablets) for 2 days, then take 80 mg (4 tablets) for 2 days, then take 60 mg (3 tablets) for 2 days, and take 40 mg (2 tablets) for 2 days then take 20 mg (1 tablet) for 2 days, then take 10 mg (half tablet) for 2 days then stop.  Take pills in the morning with food.  Patient on dexamethasone , will call her do not want to start prednisone  for cluster headache if she is taking dexamethasone 

## 2023-09-07 NOTE — Telephone Encounter (Signed)
 I send a mychart to patient, thank you

## 2023-09-07 NOTE — Telephone Encounter (Signed)
 Pt is asking for a call to discuss her cluster headaches and if there is anything that can be suggested while waiting for upcoming appointment.

## 2023-09-12 DIAGNOSIS — I10 Essential (primary) hypertension: Secondary | ICD-10-CM | POA: Diagnosis not present

## 2023-09-12 DIAGNOSIS — R519 Headache, unspecified: Secondary | ICD-10-CM | POA: Diagnosis not present

## 2023-09-12 DIAGNOSIS — Z6828 Body mass index (BMI) 28.0-28.9, adult: Secondary | ICD-10-CM | POA: Diagnosis not present

## 2023-09-12 DIAGNOSIS — I4892 Unspecified atrial flutter: Secondary | ICD-10-CM | POA: Diagnosis not present

## 2023-09-13 ENCOUNTER — Encounter: Payer: Self-pay | Admitting: Cardiology

## 2023-09-13 ENCOUNTER — Ambulatory Visit: Attending: Cardiology | Admitting: Cardiology

## 2023-09-13 ENCOUNTER — Encounter: Payer: Self-pay | Admitting: *Deleted

## 2023-09-13 VITALS — BP 120/84 | HR 74 | Ht 66.0 in | Wt 172.0 lb

## 2023-09-13 DIAGNOSIS — D6869 Other thrombophilia: Secondary | ICD-10-CM | POA: Diagnosis not present

## 2023-09-13 DIAGNOSIS — I4892 Unspecified atrial flutter: Secondary | ICD-10-CM | POA: Diagnosis not present

## 2023-09-13 DIAGNOSIS — I10 Essential (primary) hypertension: Secondary | ICD-10-CM | POA: Diagnosis not present

## 2023-09-13 NOTE — Patient Instructions (Addendum)
 Medication Instructions:  Your physician recommends that you continue on your current medications as directed. Please refer to the Current Medication list given to you today.  *If you need a refill on your cardiac medications before your next appointment, please call your pharmacy*   Lab Work: None needed  If you have a lab test that is abnormal and we need to change your treatment, we will call you to review the results -- otherwise no news is good news.    Testing/Procedures: Your physician has recommended that you have an ablation. Catheter ablation is a medical procedure used to treat some cardiac arrhythmias (irregular heartbeats). During catheter ablation, a long, thin, flexible tube is put into a blood vessel in your groin (upper thigh), or neck. This tube is called an ablation catheter. It is then guided to your heart through the blood vessel. Radio frequency waves destroy small areas of heart tissue where abnormal heartbeats may cause an arrhythmia to start.   Your ablation is scheduled for 09/16/2023. Please see instruction letter given to you today.   Follow-Up: At [facility], you and your health needs are our priority.  As part of our continuing mission to provide you with exceptional heart care, we have created designated Provider Care Teams.  These Care Teams include your primary Cardiologist (physician) and Advanced Practice Providers (APPs -  Physician Assistants and Nurse Practitioners) who all work together to provide you with the care you need, when you need it.  Your next appointment:   1 month(s) after your ablation  The format for your next appointment:   In Person  Provider:   [facility]   Thank you for choosing [facility]!!   Maeola Domino, RN [phone]    Other Instructions   Cardiac Ablation Cardiac ablation is a procedure to destroy (ablate) some heart tissue that is sending bad signals. These bad signals cause problems in heart  rhythm. The heart has many areas that make these signals. If there are problems in these areas, they can make the heart beat in a way that is not normal. Destroying some tissues can help make the heart rhythm normal. Tell your doctor about: Any allergies you have. All medicines you are taking. These include vitamins, herbs, eye drops, creams, and over-the-counter medicines. Any problems you or family members have had with medicines that make you fall asleep (anesthetics). Any blood disorders you have. Any surgeries you have had. Any medical conditions you have, such as kidney failure. Whether you are pregnant or may be pregnant. What are the risks? This is a safe procedure. But problems may occur, including: Infection. Bruising and bleeding. Bleeding into the chest. Stroke or blood clots. Damage to nearby areas of your body. Allergies to medicines or dyes. The need for a pacemaker if the normal system is damaged. Failure of the procedure to treat the problem. What happens before the procedure? Medicines Ask your doctor about: Changing or stopping your normal medicines. This is important. Taking aspirin and ibuprofen. Do not take these medicines unless your doctor tells you to take them. Taking other medicines, vitamins, herbs, and supplements. General instructions Follow instructions from your doctor about what you cannot eat or drink. Plan to have someone take you home from the hospital or clinic. If you will be going home right after the procedure, plan to have someone with you for 24 hours. Ask your doctor what steps will be taken to prevent infection. What happens during the procedure?  An IV  tube will be put into one of your veins. You will be given a medicine to help you relax. The skin on your neck or groin will be numbed. A cut (incision) will be made in your neck or groin. A needle will be put through your cut and into a large vein. A tube (catheter) will be put into the  needle. The tube will be moved to your heart. Dye may be put through the tube. This helps your doctor see your heart. Small devices (electrodes) on the tube will send out signals. A type of energy will be used to destroy some heart tissue. The tube will be taken out. Pressure will be held on your cut. This helps stop bleeding. A bandage will be put over your cut. The exact procedure may vary among doctors and hospitals. What happens after the procedure? You will be watched until you leave the hospital or clinic. This includes checking your heart rate, breathing rate, oxygen, and blood pressure. Your cut will be watched for bleeding. You will need to lie still for a few hours. Do not drive for 24 hours or as long as your doctor tells you. Summary Cardiac ablation is a procedure to destroy some heart tissue. This is done to treat heart rhythm problems. Tell your doctor about any medical conditions you may have. Tell him or her about all medicines you are taking to treat them. This is a safe procedure. But problems may occur. These include infection, bruising, bleeding, and damage to nearby areas of your body. Follow what your doctor tells you about food and drink. You may also be told to change or stop some of your medicines. After the procedure, do not drive for 24 hours or as long as your doctor tells you. This information is not intended to replace advice given to you by your health care provider. Make sure you discuss any questions you have with your health care provider. Document Revised: 04/03/2021 Document Reviewed: 12/14/2018 Elsevier Patient Education  2023 [employer]   Cardiac Ablation, Care After  This sheet gives you information about how to care for yourself after your procedure. Your health care provider may also give you more specific instructions. If you have problems or questions, contact your health care provider. What can I expect after the procedure? After the  procedure, it is common to have: Bruising around your puncture site. Tenderness around your puncture site. Skipped heartbeats. If you had an atrial fibrillation ablation, you may have atrial fibrillation during the first several months after your procedure.  Tiredness (fatigue).  Follow these instructions at home: Puncture site care  Follow instructions from your health care provider about how to take care of your puncture site. Make sure you: If present, leave stitches (sutures), skin glue, or adhesive strips in place. These skin closures may need to stay in place for up to 2 weeks. If adhesive strip edges start to loosen and curl up, you may trim the loose edges. Do not remove adhesive strips completely unless your health care provider tells you to do that. If a large square bandage is present, this may be removed 24 hours after surgery.  Check your puncture site every day for signs of infection. Check for: Redness, swelling, or pain. Fluid or blood. If your puncture site starts to bleed, lie down on your back, apply firm pressure to the area, and contact your health care provider. Warmth. Pus or a bad smell. A pea or small marble sized lump at  the site is normal and can take up to three months to resolve.  Driving Do not drive for at least 4 days after your procedure or however long your health care provider recommends. (Do not resume driving if you have previously been instructed not to drive for other health reasons.) Do not drive or use heavy machinery while taking prescription pain medicine. Activity Avoid activities that take a lot of effort for at least 7 days after your procedure. Do not lift anything that is heavier than 5 lb (4.5 kg) for one week.  No sexual activity for 1 week.  Return to your normal activities as told by your health care provider. Ask your health care provider what activities are safe for you. General instructions Take over-the-counter and prescription  medicines only as told by your health care provider. Do not use any products that contain nicotine or tobacco, such as cigarettes and e-cigarettes. If you need help quitting, ask your health care provider. You may shower after 24 hours, but Do not take baths, swim, or use a hot tub for 1 week.  Do not drink alcohol for 24 hours after your procedure. Keep all follow-up visits as told by your health care provider. This is important. Contact a health care provider if: You have redness, mild swelling, or pain around your puncture site. You have fluid or blood coming from your puncture site that stops after applying firm pressure to the area. Your puncture site feels warm to the touch. You have pus or a bad smell coming from your puncture site. You have a fever. You have chest pain or discomfort that spreads to your neck, jaw, or arm. You have chest pain that is worse with lying on your back or taking a deep breath. You are sweating a lot. You feel nauseous. You have a fast or irregular heartbeat. You have shortness of breath. You are dizzy or light-headed and feel the need to lie down. You have pain or numbness in the arm or leg closest to your puncture site. Get help right away if: Your puncture site suddenly swells. Your puncture site is bleeding and the bleeding does not stop after applying firm pressure to the area. These symptoms may represent a serious problem that is an emergency. Do not wait to see if the symptoms will go away. Get medical help right away. Call your local emergency services (911 in the [place] Do not drive yourself to the hospital. Summary After the procedure, it is normal to have bruising and tenderness at the puncture site in your groin, neck, or forearm. Check your puncture site every day for signs of infection. Get help right away if your puncture site is bleeding and the bleeding does not stop after applying firm pressure to the area. This is a medical  emergency. This information is not intended to replace advice given to you by your health care provider. Make sure you discuss any questions you have with your health care provider.

## 2023-09-13 NOTE — H&P (View-Only) (Signed)
  Electrophysiology Office Note:   Date:  09/13/2023  ID:  Susan Hansen, Susan Hansen 01/10/1961, MRN [id number]  Primary Cardiologist: Darryle ONEIDA Decent, MD Primary Heart Failure: None Electrophysiologist: None      History of Present Illness:   Susan Hansen is a 63 y.o. female with h/o hypertension, hyperlipidemia seen today for  for Electrophysiology evaluation of atrial fibrillation/flutter at the request of Susan Hansen.    She was admitted to the hospital 09/05/2023 with worsening persistent headaches.  She was found to be in atrial fibrillation at the time.  TSH was checked and was normal.  Ejection fraction was normal at that time.  She has some mild fatigue.  She does not have much shortness of breath.  She has no chest pain.  She is able to do most of her daily activities but has to do them more slowly due to her fatigue.  She would prefer rhythm control at this time.  Review of systems complete and found to be negative unless listed in HPI.   EP Information / Studies Reviewed:    EKG is ordered today. Personal review as below.  EKG Interpretation Date/Time:  Tuesday September 13 2023 14:58:52 EDT Ventricular Rate:  74 PR Interval:    QRS Duration:  126 QT Interval:  396 QTC Calculation: 439 R Axis:   61  Text Interpretation: Atrial flutter with variable A-V block When compared with ECG of 05-Sep-2023 12:09, No significant change since last tracing Confirmed by Evalyse Stroope ([id number]) on 09/13/2023 3:02:47 PM     Risk Assessment/Calculations:    CHA2DS2-VASc Score = 3   This indicates a 3.2% annual risk of stroke. The patient's score is based upon: CHF History: 0 HTN History: 1 Diabetes History: 0 Stroke History: 0 Vascular Disease History: 0 Age Score: 1 Gender Score: 1        Physical Exam:   VS:  BP 120/84 (BP Location: Right Arm, Patient Position: Sitting, Cuff Size: Normal)   Pulse 74   Ht 5' 6 (1.676 m)   Wt 172 lb (78 kg)   SpO2 96%   BMI 27.76 kg/m    Wt  Readings from Last 3 Encounters:  09/13/23 172 lb (78 kg)  09/06/23 170 lb 13.7 oz (77.5 kg)  07/12/23 170 lb (77.1 kg)     GEN: Well nourished, well developed in no acute distress NECK: No JVD; No carotid bruits CARDIAC: Regular rate and rhythm, no murmurs, rubs, gallops RESPIRATORY:  Clear to auscultation without rales, wheezing or rhonchi  ABDOMEN: Soft, non-tender, non-distended EXTREMITIES:  No edema; No deformity   ASSESSMENT AND PLAN:    1.  Typical atrial flutter: Admitted to the hospital was found to be in atrial fibrillation/flutter.  TSH normal.  Normal ejection fraction and echo without structural abnormalities.  She remains in atrial flutter.  She is on medications for seizures and thus would likely have interactions with multiple antiarrhythmics.  Due to this, we Dahl Higinbotham plan for ablation.  Risks and benefits have been discussed.  She understands the risks and is agreed to the procedure.  As she has recently started her Eliquis , Jlyn Cerros plan for TEE prior to ablation.  2.  Secondary hypercoagulable state: On Eliquis   3.  Hypertension: Well-controlled   Follow up with EP Team as usual post procedure  Signed, Gus Littler Gladis Norton, MD

## 2023-09-13 NOTE — Progress Notes (Signed)
  Electrophysiology Office Note:   Date:  09/13/2023  ID:  Susan Hansen, Susan Hansen 01/10/1961, MRN [id number]  Primary Cardiologist: Darryle ONEIDA Decent, MD Primary Heart Failure: None Electrophysiologist: None      History of Present Illness:   Susan Hansen is a 63 y.o. female with h/o hypertension, hyperlipidemia seen today for  for Electrophysiology evaluation of atrial fibrillation/flutter at the request of Charmaine Bright.    She was admitted to the hospital 09/05/2023 with worsening persistent headaches.  She was found to be in atrial fibrillation at the time.  TSH was checked and was normal.  Ejection fraction was normal at that time.  She has some mild fatigue.  She does not have much shortness of breath.  She has no chest pain.  She is able to do most of her daily activities but has to do them more slowly due to her fatigue.  She would prefer rhythm control at this time.  Review of systems complete and found to be negative unless listed in HPI.   EP Information / Studies Reviewed:    EKG is ordered today. Personal review as below.  EKG Interpretation Date/Time:  Tuesday September 13 2023 14:58:52 EDT Ventricular Rate:  74 PR Interval:    QRS Duration:  126 QT Interval:  396 QTC Calculation: 439 R Axis:   61  Text Interpretation: Atrial flutter with variable A-V block When compared with ECG of 05-Sep-2023 12:09, No significant change since last tracing Confirmed by Evalyse Stroope ([id number]) on 09/13/2023 3:02:47 PM     Risk Assessment/Calculations:    CHA2DS2-VASc Score = 3   This indicates a 3.2% annual risk of stroke. The patient's score is based upon: CHF History: 0 HTN History: 1 Diabetes History: 0 Stroke History: 0 Vascular Disease History: 0 Age Score: 1 Gender Score: 1        Physical Exam:   VS:  BP 120/84 (BP Location: Right Arm, Patient Position: Sitting, Cuff Size: Normal)   Pulse 74   Ht 5' 6 (1.676 m)   Wt 172 lb (78 kg)   SpO2 96%   BMI 27.76 kg/m    Wt  Readings from Last 3 Encounters:  09/13/23 172 lb (78 kg)  09/06/23 170 lb 13.7 oz (77.5 kg)  07/12/23 170 lb (77.1 kg)     GEN: Well nourished, well developed in no acute distress NECK: No JVD; No carotid bruits CARDIAC: Regular rate and rhythm, no murmurs, rubs, gallops RESPIRATORY:  Clear to auscultation without rales, wheezing or rhonchi  ABDOMEN: Soft, non-tender, non-distended EXTREMITIES:  No edema; No deformity   ASSESSMENT AND PLAN:    1.  Typical atrial flutter: Admitted to the hospital was found to be in atrial fibrillation/flutter.  TSH normal.  Normal ejection fraction and echo without structural abnormalities.  She remains in atrial flutter.  She is on medications for seizures and thus would likely have interactions with multiple antiarrhythmics.  Due to this, we Dahl Higinbotham plan for ablation.  Risks and benefits have been discussed.  She understands the risks and is agreed to the procedure.  As she has recently started her Eliquis , Jlyn Cerros plan for TEE prior to ablation.  2.  Secondary hypercoagulable state: On Eliquis   3.  Hypertension: Well-controlled   Follow up with EP Team as usual post procedure  Signed, Gus Littler Gladis Norton, MD

## 2023-09-15 NOTE — Pre-Procedure Instructions (Signed)
 Attempted to call patient regarding procedure instructions.  Left voicemail on the following items: Arrival time 0515 Nothing to eat or drink after midnight No meds AM of procedure Responsible person to drive you home and stay with you for 24 hrs  Have you missed any doses of anti-coagulant Eliquis - should be taken twice a day day, if you have missed any doses please let us  know.

## 2023-09-16 ENCOUNTER — Other Ambulatory Visit: Payer: Self-pay

## 2023-09-16 ENCOUNTER — Encounter (HOSPITAL_COMMUNITY)
Admission: RE | Disposition: A | Discharge status: Home / Self Care | Payer: Self-pay | Source: Home / Self Care | Attending: Cardiology

## 2023-09-16 ENCOUNTER — Telehealth: Payer: Self-pay | Admitting: Cardiology

## 2023-09-16 ENCOUNTER — Ambulatory Visit (HOSPITAL_COMMUNITY)
Admission: RE | Admit: 2023-09-16 | Discharge: 2023-09-16 | Disposition: A | Discharge status: Home / Self Care | Attending: Cardiology | Admitting: Cardiology

## 2023-09-16 ENCOUNTER — Ambulatory Visit (HOSPITAL_COMMUNITY)
Admission: RE | Admit: 2023-09-16 | Discharge: 2023-09-16 | Disposition: A | Discharge status: Home / Self Care | Source: Home / Self Care | Attending: Cardiology | Admitting: Cardiology

## 2023-09-16 ENCOUNTER — Ambulatory Visit (HOSPITAL_COMMUNITY): Admitting: Registered Nurse

## 2023-09-16 DIAGNOSIS — I483 Typical atrial flutter: Secondary | ICD-10-CM | POA: Insufficient documentation

## 2023-09-16 DIAGNOSIS — I4891 Unspecified atrial fibrillation: Secondary | ICD-10-CM | POA: Diagnosis not present

## 2023-09-16 DIAGNOSIS — I4892 Unspecified atrial flutter: Secondary | ICD-10-CM

## 2023-09-16 DIAGNOSIS — I10 Essential (primary) hypertension: Secondary | ICD-10-CM

## 2023-09-16 DIAGNOSIS — Z0279 Encounter for issue of other medical certificate: Secondary | ICD-10-CM

## 2023-09-16 DIAGNOSIS — D6869 Other thrombophilia: Secondary | ICD-10-CM | POA: Insufficient documentation

## 2023-09-16 DIAGNOSIS — Z7901 Long term (current) use of anticoagulants: Secondary | ICD-10-CM | POA: Insufficient documentation

## 2023-09-16 HISTORY — PX: TRANSESOPHAGEAL ECHOCARDIOGRAM (CATH LAB): EP1270

## 2023-09-16 HISTORY — PX: A-FLUTTER ABLATION: EP1230

## 2023-09-16 HISTORY — PX: LOOP RECORDER INSERTION: EP1214

## 2023-09-16 LAB — NO BLOOD PRODUCTS

## 2023-09-16 LAB — ECHO TEE

## 2023-09-16 MED ORDER — DEXAMETHASONE SODIUM PHOSPHATE 10 MG/ML IJ SOLN
INTRAMUSCULAR | Status: DC | PRN
  Administered 2023-09-16: 8 mg via INTRAVENOUS

## 2023-09-16 MED ORDER — ONDANSETRON HCL 4 MG/2ML IJ SOLN
INTRAMUSCULAR | Status: DC | PRN
  Administered 2023-09-16: 4 mg via INTRAVENOUS

## 2023-09-16 MED ORDER — HEPARIN (PORCINE) IN NACL 1000-0.9 UT/500ML-% IV SOLN
INTRAVENOUS | Status: DC | PRN
Start: 2023-09-16 — End: 2023-09-16
  Administered 2023-09-16: 500 mL

## 2023-09-16 MED ORDER — LIDOCAINE-EPINEPHRINE 1 %-1:100000 IJ SOLN
INTRAMUSCULAR | Status: DC | PRN
  Administered 2023-09-16: 20 mL

## 2023-09-16 MED ORDER — SUGAMMADEX SODIUM 200 MG/2ML IV SOLN
INTRAVENOUS | Status: DC | PRN
  Administered 2023-09-16: 312 mg via INTRAVENOUS

## 2023-09-16 MED ORDER — LIDOCAINE-EPINEPHRINE 1 %-1:100000 IJ SOLN
INTRAMUSCULAR | Status: AC
  Filled 2023-09-16: qty 1

## 2023-09-16 MED ORDER — FENTANYL CITRATE (PF) 250 MCG/5ML IJ SOLN
INTRAMUSCULAR | Status: DC | PRN
  Administered 2023-09-16 (×2): 50 ug via INTRAVENOUS

## 2023-09-16 MED ORDER — SODIUM CHLORIDE 0.9 % IV SOLN
INTRAVENOUS | Status: DC
Start: 2023-09-16 — End: 2023-09-16

## 2023-09-16 MED ORDER — LIDOCAINE 2% (20 MG/ML) 5 ML SYRINGE
INTRAMUSCULAR | Status: DC | PRN
  Administered 2023-09-16: 60 mg via INTRAVENOUS

## 2023-09-16 MED ORDER — PHENYLEPHRINE HCL-NACL 20-0.9 MG/250ML-% IV SOLN
INTRAVENOUS | Status: DC | PRN
Start: 2023-09-16 — End: 2023-09-16
  Administered 2023-09-16: 50 ug/min via INTRAVENOUS

## 2023-09-16 MED ORDER — SODIUM CHLORIDE 0.9 % IV SOLN
250.0000 mL | INTRAVENOUS | Status: DC | PRN
Start: 2023-09-16 — End: 2023-09-16

## 2023-09-16 MED ORDER — ACETAMINOPHEN 325 MG PO TABS: 650.0000 mg | ORAL_TABLET | ORAL | Status: DC | PRN

## 2023-09-16 MED ORDER — ONDANSETRON HCL 4 MG/2ML IJ SOLN
4.0000 mg | Freq: Four times a day (QID) | INTRAMUSCULAR | Status: DC | PRN
Start: 2023-09-16 — End: 2023-09-16

## 2023-09-16 MED ORDER — SODIUM CHLORIDE 0.9% FLUSH
3.0000 mL | INTRAVENOUS | Status: DC | PRN
Start: 2023-09-16 — End: 2023-09-16

## 2023-09-16 MED ORDER — PROPOFOL 10 MG/ML IV BOLUS
INTRAVENOUS | Status: DC | PRN
  Administered 2023-09-16: 140 mg via INTRAVENOUS

## 2023-09-16 MED ORDER — ROCURONIUM BROMIDE 10 MG/ML (PF) SYRINGE
PREFILLED_SYRINGE | INTRAVENOUS | Status: DC | PRN
  Administered 2023-09-16: 10 mg via INTRAVENOUS
  Administered 2023-09-16: 20 mg via INTRAVENOUS
  Administered 2023-09-16: 60 mg via INTRAVENOUS

## 2023-09-16 MED ORDER — FENTANYL CITRATE (PF) 100 MCG/2ML IJ SOLN
INTRAMUSCULAR | Status: AC
  Filled 2023-09-16: qty 2

## 2023-09-16 MED ORDER — PHENYLEPHRINE 80 MCG/ML (10ML) SYRINGE FOR IV PUSH (FOR BLOOD PRESSURE SUPPORT)
PREFILLED_SYRINGE | INTRAVENOUS | Status: DC | PRN
  Administered 2023-09-16: 80 ug via INTRAVENOUS
  Administered 2023-09-16: 120 ug via INTRAVENOUS

## 2023-09-16 NOTE — Progress Notes (Signed)
 No s/s of complications at femoral or chest site. Discharge instructions reviewed with patient and husband at bedside. Denies questions or concerns at this time. PT ambulated to the bathroom where she was able to void without difficulty. PT escorted from the unit via wheel chair to personal vehicle.

## 2023-09-16 NOTE — Discharge Instructions (Addendum)
 Femoral Site Care This sheet gives you information about how to care for yourself after your procedure. Your health care provider may also give you more specific instructions. If you have problems or questions, contact your health care provider. What can I expect after the procedure?  After the procedure, it is common to have: Bruising that usually fades within 1-2 weeks. Tenderness at the site. Follow these instructions at home: Wound care Follow instructions from your health care provider about how to take care of your insertion site. Make sure you: Wash your hands with soap and water before you change your bandage (dressing). If soap and water are not available, use hand sanitizer. Remove your dressing as told by your health care provider. In 24 hours Do not take baths, swim, or use a hot tub until your health care provider approves. You may shower 24-48 hours after the procedure or as told by your health care provider. Gently wash the site with plain soap and water. Pat the area dry with a clean towel. Do not rub the site. This may cause bleeding. Do not apply powder or lotion to the site. Keep the site clean and dry. Check your femoral site every day for signs of infection. Check for: Redness, swelling, or pain. Fluid or blood. Warmth. Pus or a bad smell. Activity For the first 2-3 days after your procedure, or as long as directed: Avoid climbing stairs as much as possible. Do not squat. Do not lift anything that is heavier than 10 lb (4.5 kg), or the limit that you are told, until your health care provider says that it is safe. For 5 days Rest as directed. Avoid sitting for a long time without moving. Get up to take short walks every 1-2 hours. Do not drive for 24 hours if you were given a medicine to help you relax (sedative). General instructions Take over-the-counter and prescription medicines only as told by your health care provider. Keep all follow-up visits as told by  your health care provider. This is important. Contact a health care provider if you have: A fever or chills. You have redness, swelling, or pain around your insertion site. Get help right away if: The catheter insertion area swells very fast. You pass out. You suddenly start to sweat or your skin gets clammy. The catheter insertion area is bleeding, and the bleeding does not stop when you hold steady pressure on the area. The area near or just beyond the catheter insertion site becomes pale, cool, tingly, or numb. These symptoms may represent a serious problem that is an emergency. Do not wait to see if the symptoms will go away. Get medical help right away. Call your local emergency services (911 in the [place] Do not drive yourself to the hospital. Summary After the procedure, it is common to have bruising that usually fades within 1-2 weeks. Check your femoral site every day for signs of infection. Do not lift anything that is heavier than 10 lb (4.5 kg), or the limit that you are told, until your health care provider says that it is safe. This information is not intended to replace advice given to you by your health care provider. Make sure you discuss any questions you have with your health care provider. Document Revised: 01/24/2017 Document Reviewed: 01/24/2017 Elsevier Patient Education  2020 [employer]  Cardiac Ablation, Care After  This sheet gives you information about how to care for yourself after your procedure. Your health care provider may also give you  more specific instructions. If you have problems or questions, contact your health care provider. What can I expect after the procedure? After the procedure, it is common to have: Bruising around your puncture site. Tenderness around your puncture site. Skipped heartbeats. If you had an atrial fibrillation ablation, you may have atrial fibrillation during the first several months after your procedure.  Tiredness  (fatigue).  Follow these instructions at home: Puncture site care  Follow instructions from your health care provider about how to take care of your puncture site. Make sure you: If present, leave stitches (sutures), skin glue, or adhesive strips in place. These skin closures may need to stay in place for up to 2 weeks. If adhesive strip edges start to loosen and curl up, you may trim the loose edges. Do not remove adhesive strips completely unless your health care provider tells you to do that. If a large square bandage is present, this may be removed 24 hours after surgery.  Check your puncture site every day for signs of infection. Check for: Redness, swelling, or pain. Fluid or blood. If your puncture site starts to bleed, lie down on your back, apply firm pressure to the area, and contact your health care provider. Warmth. Pus or a bad smell. A pea or marble sized lump/knot at the site is normal and can take up to three months to resolve.  Driving Do not drive for at least 4 days after your procedure or however long your health care provider recommends. (Do not resume driving if you have previously been instructed not to drive for other health reasons.) Do not drive or use heavy machinery while taking prescription pain medicine. Activity Avoid activities that take a lot of effort for at least 7 days after your procedure. Do not lift anything that is heavier than 5 lb (4.5 kg) for one week.  No sexual activity for 1 week.  Return to your normal activities as told by your health care provider. Ask your health care provider what activities are safe for you. General instructions Take over-the-counter and prescription medicines only as told by your health care provider. Do not use any products that contain nicotine or tobacco, such as cigarettes and e-cigarettes. If you need help quitting, ask your health care provider. You may shower after 24 hours, but Do not take baths, swim, or use a hot  tub for 1 week.  Do not drink alcohol for 24 hours after your procedure. Keep all follow-up visits as told by your health care provider. This is important. Contact a health care provider if: You have redness, mild swelling, or pain around your puncture site. You have fluid or blood coming from your puncture site that stops after applying firm pressure to the area. Your puncture site feels warm to the touch. You have pus or a bad smell coming from your puncture site. You have a fever. You have chest pain or discomfort that spreads to your neck, jaw, or arm. You have chest pain that is worse with lying on your back or taking a deep breath. You are sweating a lot. You feel nauseous. You have a fast or irregular heartbeat. You have shortness of breath. You are dizzy or light-headed and feel the need to lie down. You have pain or numbness in the arm or leg closest to your puncture site. Get help right away if: Your puncture site suddenly swells. Your puncture site is bleeding and the bleeding does not stop after applying firm pressure to  the area. These symptoms may represent a serious problem that is an emergency. Do not wait to see if the symptoms will go away. Get medical help right away. Call your local emergency services (911 in the [place] Do not drive yourself to the hospital. Summary After the procedure, it is normal to have bruising and tenderness at the puncture site in your groin, neck, or forearm. Check your puncture site every day for signs of infection. Get help right away if your puncture site is bleeding and the bleeding does not stop after applying firm pressure to the area. This is a medical emergency. This information is not intended to replace advice given to you by your health care provider. Make sure you discuss any questions you have with your health care provider.

## 2023-09-16 NOTE — Anesthesia Procedure Notes (Signed)
 Procedure Name: Intubation Date/Time: 09/16/2023 7:40 AM  Performed by: Virgil Ee, CRNAPre-anesthesia Checklist: Patient identified, Patient being monitored, Timeout performed, Emergency Drugs available and Suction available Patient Re-evaluated:Patient Re-evaluated prior to induction Oxygen Delivery Method: Circle system utilized Preoxygenation: Pre-oxygenation with 100% oxygen Induction Type: IV induction Ventilation: Mask ventilation without difficulty Laryngoscope Size: Mac and 4 Grade View: Grade II Tube type: Oral Tube size: 7.0 mm Number of attempts: 1 Airway Equipment and Method: Stylet Placement Confirmation: ETT inserted through vocal cords under direct vision, positive ETCO2 and breath sounds checked- equal and bilateral Secured at: 22 cm Tube secured with: Tape Dental Injury: Teeth and Oropharynx as per pre-operative assessment

## 2023-09-16 NOTE — Anesthesia Postprocedure Evaluation (Signed)
 Anesthesia Post Note  Patient: Susan Hansen  Procedure(s) Performed: A-FLUTTER ABLATION TRANSESOPHAGEAL ECHOCARDIOGRAM LOOP RECORDER INSERTION ECHO TEE     Patient location during evaluation: PACU Anesthesia Type: General Level of consciousness: awake and alert Pain management: pain level controlled Vital Signs Assessment: post-procedure vital signs reviewed and stable Respiratory status: spontaneous breathing, nonlabored ventilation, respiratory function stable and patient connected to nasal cannula oxygen Cardiovascular status: blood pressure returned to baseline and stable Postop Assessment: no apparent nausea or vomiting Anesthetic complications: no   There were no known notable events for this encounter.  Last Vitals:  Vitals:   09/16/23 1100 09/16/23 1200  BP: 110/65 110/64  Pulse: 93 96  Resp: 15 19  Temp:    SpO2: 96% 96%    Last Pain:  Vitals:   09/16/23 0940  TempSrc:   PainSc: 5                  Dakoda Bassette P Jenai Scaletta

## 2023-09-16 NOTE — Progress Notes (Signed)
  Echocardiogram Echocardiogram Transesophageal has been performed.  Susan Hansen 09/16/2023, 8:11 AM

## 2023-09-16 NOTE — Transfer of Care (Signed)
 Immediate Anesthesia Transfer of Care Note  Patient: Susan Hansen  Procedure(s) Performed: A-FLUTTER ABLATION TRANSESOPHAGEAL ECHOCARDIOGRAM LOOP RECORDER INSERTION ECHO TEE  Patient Location: Cath Lab  Anesthesia Type:General  Level of Consciousness: awake  Airway & Oxygen Therapy: Patient Spontanous Breathing  Post-op Assessment: Report given to RN and Post -op Vital signs reviewed and stable  Post vital signs: Reviewed and stable  Last Vitals:  Vitals Value Taken Time  BP 116/72 09/16/23 09:00  Temp 36.5 C 09/16/23 09:00  Pulse 91 09/16/23 09:03  Resp 18 09/16/23 09:03  SpO2 100 % 09/16/23 09:03  Vitals shown include unfiled device data.  Last Pain:  Vitals:   09/16/23 0900  TempSrc: Oral         Complications: There were no known notable events for this encounter.

## 2023-09-16 NOTE — Interval H&P Note (Signed)
 History and Physical Interval Note:  09/16/2023 7:06 AM  Susan Hansen  has presented today for surgery, with the diagnosis of aflutter.  The various methods of treatment have been discussed with the patient and family. After consideration of risks, benefits and other options for treatment, the patient has consented to  Procedure(s): A-FLUTTER ABLATION (N/A) TRANSESOPHAGEAL ECHOCARDIOGRAM (N/A) as a surgical intervention.  The patient's history has been reviewed, patient examined, no change in status, stable for surgery.  I have reviewed the patient's chart and labs.  Questions were answered to the patient's satisfaction.     Dionisio Aragones [employer]

## 2023-09-16 NOTE — Anesthesia Preprocedure Evaluation (Signed)
 Anesthesia Evaluation  Patient identified by MRN, date of birth, ID band Patient awake    Reviewed: Allergy & Precautions, NPO status , Patient's Chart, lab work & pertinent test results  Airway Mallampati: II  TM Distance: >3 FB Neck ROM: Full    Dental no notable dental hx.    Pulmonary neg pulmonary ROS   Pulmonary exam normal        Cardiovascular hypertension, + dysrhythmias Atrial Fibrillation  Rhythm:Irregular Rate:Normal  ECHO  1. Left ventricular ejection fraction, by estimation, is 60 to 65%. The left ventricle has normal function. The left ventricle has no regional wall motion abnormalities. Left ventricular diastolic parameters are indeterminate.  2. Right ventricular systolic function is low normal. The right ventricular size is mildly enlarged. There is normal pulmonary artery systolic pressure. The estimated right ventricular systolic pressure is 18.1 mmHg.  3. The mitral valve is grossly normal. Mild mitral valve regurgitation. No evidence of mitral stenosis.  4. Focal calcification noted on the NCC. The aortic valve is tricuspid. There is mild calcification of the aortic valve. There is mild thickening of the aortic valve. Aortic valve regurgitation is not visualized. Aortic valve sclerosis is present, with no evidence of aortic valve stenosis.  5. The inferior vena cava is dilated in size with >50% respiratory variability, suggesting right atrial pressure of 8 mmHg.   Comparison(s): No significant change from prior study.    Neuro/Psych  Headaches, Seizures -,   negative psych ROS   GI/Hepatic negative GI ROS, Neg liver ROS,,,  Endo/Other  negative endocrine ROS    Renal/GU negative Renal ROS  negative genitourinary   Musculoskeletal negative musculoskeletal ROS (+)    Abdominal Normal abdominal exam  (+)   Peds  Hematology Lab Results      Component                Value               Date                       WBC                      4.4                 09/05/2023                HGB                      13.9                09/05/2023                HCT                      41.6                09/05/2023                MCV                      94.1                09/05/2023                PLT  191                 09/05/2023             Lab Results      Component                Value               Date                      NA                       138                 09/05/2023                K                        3.6                 09/05/2023                CO2                      24                  09/05/2023                GLUCOSE                  113 (H)             09/05/2023                BUN                      17                  09/05/2023                CREATININE               0.76                09/05/2023                CALCIUM                   9.5                 09/05/2023                EGFR                     98                  07/31/2020                GFRNONAA                 >60                 09/05/2023              Anesthesia Other Findings   Reproductive/Obstetrics                              Anesthesia  Physical Anesthesia Plan  ASA: 3  Anesthesia Plan: General   Post-op Pain Management:    Induction: Intravenous  PONV Risk Score and Plan: 3 and Ondansetron , Dexamethasone , Midazolam and Treatment may vary due to age or medical condition  Airway Management Planned: Mask and Oral ETT  Additional Equipment: None  Intra-op Plan:   Post-operative Plan: Extubation in OR  Informed Consent: I have reviewed the patients History and Physical, chart, labs and discussed the procedure including the risks, benefits and alternatives for the proposed anesthesia with the patient or authorized representative who has indicated his/her understanding and acceptance.     Dental advisory given  Plan Discussed  with: CRNA  Anesthesia Plan Comments:         Anesthesia Quick Evaluation

## 2023-09-16 NOTE — Telephone Encounter (Signed)
 Received [employer] form today.  Patient signed the release of information and paid the $29 form charge.  Form is in Dr. Carolyn box.

## 2023-09-16 NOTE — Procedures (Signed)
   TRANSESOPHAGEAL ECHOCARDIOGRAM  NAME:  Susan Hansen    MRN: [id number] DOB:  09/06/1960    ADMIT DATE: 09/16/2023  INDICATIONS: Atrial flutter  PROCEDURE:   Informed consent was obtained prior to the procedure. The risks, benefits and alternatives for the procedure were discussed and the patient comprehended these risks.  Risks include, but are not limited to, cough, sore throat, vomiting, nausea, somnolence, esophageal and stomach trauma or perforation, bleeding, low blood pressure, aspiration, pneumonia, infection, trauma to the teeth and death.    Patient was previously sedated and intubated by anesthesia prior to probe insertion.   The transesophageal probe was inserted in the esophagus and stomach without difficulty and multiple views were obtained.  The patient was kept under observation until the patient left the procedure room.  The patient left the procedure room in stable condition.    COMPLICATIONS:    Complications: No complications.  The patient had normal neuro status and respiratory status post procedure with vitals stable as recorded elsewhere.  Adequate airway was maintained throughout and vital signs monitored per protocol.  KEY FINDINGS:  No LAA thrombus Preserved LV function despite tachycardia Mildly reduced RV function No significant valvular pathology Full Report to follow.   Morene Brownie Advanced Heart Failure 12:39 PM

## 2023-09-17 ENCOUNTER — Encounter (HOSPITAL_COMMUNITY): Payer: Self-pay | Admitting: Cardiology

## 2023-09-19 ENCOUNTER — Telehealth: Payer: Self-pay | Admitting: Neurology

## 2023-09-19 ENCOUNTER — Other Ambulatory Visit: Payer: Self-pay | Admitting: Neurology

## 2023-09-19 ENCOUNTER — Telehealth (HOSPITAL_COMMUNITY): Payer: Self-pay

## 2023-09-19 DIAGNOSIS — G44019 Episodic cluster headache, not intractable: Secondary | ICD-10-CM

## 2023-09-19 MED ORDER — PREDNISONE 20 MG PO TABS: ORAL_TABLET | ORAL | 1 refills | Status: DC

## 2023-09-19 NOTE — Telephone Encounter (Signed)
 Absolutely, send prescription and she has a refill on it too thanks

## 2023-09-19 NOTE — Telephone Encounter (Signed)
 Pt has returned call to RN

## 2023-09-19 NOTE — Telephone Encounter (Signed)
 This pt has an appointment 09-29-2023.  I called and LMVM for her to call us  back.  (Need clarification on her message).  Did the prednisone  work for her this last round.  This request is for backup? Incase pain return?

## 2023-09-19 NOTE — Telephone Encounter (Signed)
 Pt is asking for a call from RN to discuss if possible more  predniSONE  (DELTASONE ) 20 MG tablet can be called in for her so she does not experience the headaches again

## 2023-09-19 NOTE — Telephone Encounter (Signed)
 Pt returned call. She said that the prednisone  that she had has worked and she has 1/2 tablet left for tomorrow.  She still gets some slight twinges of headache like she will get and is afraid will come back.  She has been out of work and due to go back 09-26-2023 (this for another issue)  and is asking if another prescription to have as a back up is possible.  I relayed that will ask and let her know as soon as I hear back from Dr. Ines.

## 2023-09-19 NOTE — Telephone Encounter (Signed)
 Attempted to reach patient to follow up with procedure completed on 09/16/23, no answer. Left VM for patient to return call.

## 2023-09-19 NOTE — Telephone Encounter (Signed)
 I called pt and relayed Dr Avelina message that sent in if she needs it. (With additional refill). She appreciated so much.

## 2023-09-20 MED FILL — Fentanyl Citrate Preservative Free (PF) Inj 100 MCG/2ML: INTRAMUSCULAR | Qty: 2 | Status: AC

## 2023-09-20 NOTE — Telephone Encounter (Signed)
 Received VM from patient returning call. Attempted to reach patient back, no answer. Left detailed message regarding instructions:  It is normal to have bruising, tenderness, mild swelling, and a pea or marble sized lump/knot at the groin site which can take up to three months to resolve.  Check your puncture site every day for signs of infection: fever, redness, swelling, pus drainage, warmth, foul odor or excessive pain. If this occurs, please call the office at [phone], to speak with the nurse. Get help right away if your puncture site is bleeding and the bleeding does not stop after applying firm pressure to the area.  You may continue to have skipped beats/ atrial fibrillation during the first several months after your procedure.  It is very important not to miss any doses of your blood thinner Eliquis .   You will follow up with the APP on 10/19/23.    Advised to return call if any questions or concerns.

## 2023-09-22 ENCOUNTER — Telehealth: Payer: Self-pay

## 2023-09-22 ENCOUNTER — Encounter: Payer: Self-pay | Admitting: Cardiology

## 2023-09-22 ENCOUNTER — Telehealth: Payer: Self-pay | Admitting: Cardiology

## 2023-09-22 NOTE — Telephone Encounter (Signed)
 Pt c/o medication issue:  1. Name of Medication:   lisinopril  (ZESTRIL ) 20 MG tablet   2. How are you currently taking this medication (dosage and times per day)?   As prescribed  3. Are you having a reaction (difficulty breathing--STAT)?  Dizziness  4. What is your medication issue?   Patient stated that since she had this medication change she has been having dizziness.  Patient noted she has a problem with headaches and stated she also put a message in MyChart regarding this.   STAT if patient feels like he/she is going to faint   1. Are you feeling dizzy, lightheaded, or faint right now?   Yes - a little dizzy  2. Have you passed out?   No (If yes move to .[facility]NC[facility])  3. Do you have any other symptoms?   No  4. Have you checked your HR and BP (record if available)?   Not available

## 2023-09-22 NOTE — Telephone Encounter (Signed)
  Loop Recorder Follow up   Is patient connected to [facility]? Yes   Have steri-strips fallen off or been removed? Unable to reach patient    Does the patient need in office follow up? No   Please continue to monitor your cardiac device site for redness, swelling, and drainage. Call the device clinic at [phone] if you experience these symptoms, fever/chills, or have questions about your device.   Remote monitoring is used to monitor your cardiac device from home. This monitoring is scheduled every month by our office. It allows us  to keep an eye on the functioning of your device to ensure it is working properly.

## 2023-09-22 NOTE — Telephone Encounter (Signed)
 After the patient wrote in and called, she realized that she normally was taking food w her morning medication and has not been recently.  She is on a taper dose of prednisone  for cluster headaches.  She realizes and believes that not having the food w meds was causing the dizziness.  Her BP is 122/74.  She is so pleased that dizziness has resolved.  Seeing neuro 09/29/23.

## 2023-09-22 NOTE — Telephone Encounter (Signed)
 Returned call to patient.  She also sent a mychart message.  See other encounter from today for details.

## 2023-09-23 ENCOUNTER — Encounter: Payer: Self-pay | Admitting: Neurology

## 2023-09-25 ENCOUNTER — Encounter: Payer: Self-pay | Admitting: Cardiology

## 2023-09-29 ENCOUNTER — Ambulatory Visit: Payer: No Typology Code available for payment source | Admitting: Neurology

## 2023-09-29 ENCOUNTER — Encounter: Payer: Self-pay | Admitting: Neurology

## 2023-09-29 VITALS — BP 135/76 | HR 74 | Ht 66.0 in | Wt 172.6 lb

## 2023-09-29 DIAGNOSIS — G40219 Localization-related (focal) (partial) symptomatic epilepsy and epileptic syndromes with complex partial seizures, intractable, without status epilepticus: Secondary | ICD-10-CM | POA: Diagnosis not present

## 2023-09-29 DIAGNOSIS — G44019 Episodic cluster headache, not intractable: Secondary | ICD-10-CM | POA: Diagnosis not present

## 2023-09-29 MED ORDER — FELBAMATE 600 MG PO TABS: 1800.0000 mg | ORAL_TABLET | Freq: Three times a day (TID) | ORAL | 3 refills | Status: AC

## 2023-09-29 MED ORDER — PREDNISONE 20 MG PO TABS: ORAL_TABLET | ORAL | 2 refills | Status: DC

## 2023-09-29 MED ORDER — ZEMBRACE SYMTOUCH 3 MG/0.5ML ~~LOC~~ SOAJ: 3.0000 mg | Freq: Once | SUBCUTANEOUS | 11 refills | Status: DC | PRN

## 2023-09-29 MED ORDER — TOSYMRA 10 MG/ACT NA SOLN
1.0000 | NASAL | 11 refills | Status: DC | PRN
Start: 2023-09-29 — End: 2023-12-01

## 2023-09-29 NOTE — Patient Instructions (Addendum)
 Steroids on hand Sumatriptan  injection(zembrace) can take up to 4 injections a day Tosymra  (Sumatriptan ) nasal spray) Lidocaine  nasal spray: Compound a lidocaine  spray for acute migraine and episodic migraine use: 4% lidocaine  solution to use intranasally as an abortive therapy. Four sprays of lidocaine  ipsilateral to the pain at onset, and two more, if necessary, in 10 minutes. Q6hours as needed.  [facility] [address] - you can call when needed(KEEP ON FILE) Order Oxygen 12-15L/min high flow mask:  Inhaled oxygen at 100%, 12 L/min, delivered by face mask, for 15 minutes at the start of an attack of cluster headache   ON HAND: Start a high-dose steroid taper on start of cluster: please keep on hand explained it has to be high dose over 2 weeks for cluster headaches and the risks and side effects with agreement and understanding form patient. This is the quickest way to treat episodic cluster cycles at onset. Take 100 mg (5 tablets) for 2 days, then take 80 mg (4 tablets) for 2 days, then take 60 mg (3 tablets) for 2 days, and take 40 mg (2 tablets) for 2 days then take 20 mg (1 tablet) for 2 days, then take 10 mg (half tablet) for 2 days then stop.  Take pills in the morning with food.

## 2023-09-29 NOTE — Progress Notes (Signed)
 [facility]    Provider:  Dr Ines Requesting Provider: Katina Pfeiffer, PA-C Primary Care Provider:  Katina Pfeiffer, PA-C  CC:  Seizures,stable  09/29/2023: seizures stable, discussed at length strategies for her cluster headaches. No other focal neurologic deficits, associated symptoms, inciting events or modifiable factors.Patient complains of symptoms per HPI as well as the following symptoms: none . Pertinent negatives and positives per HPI. All others negative   09/23/2022: refilled meds, stable, no seizures, she pays hundreds of dollars for her seizure meds, she has abdominal auras but no seizures and hasn't had to use any ativan  but will refill so if she has a substantial aura she can try and take 1-2, no mossed dose Last felbamate  level was normal  Patient complains of symptoms per HPI as well as the following symptoms: none . Pertinent negatives and positives per HPI. All others negative   09/10/2021: Needs a new prescription of felbamate , we may need patient assistance, has not had a seizure, she has seizure aura and will often take extra pill or not go to work. She has been on Felbamate  for years and done well, she is stable. In the past we have confirmed this dose of her felbamate  with her prior neurologist. Prescribed felbamate , she will let us  know if approved, if expensive, if needing patient assistance. We can call and get felbamate  samples or at least try, send a week to local pharmacy si doesn;t run out and [facility]. Also fill out FMLA give 5 days as needed for seizures.  She has about 3 days left so this is critical at this time, we will work on this asap today. She prefers we let her know via mychart what happens with the felbamate . I will send a week to the pharmacy she may have to pay out of pocket temporarily if we cannot get her assistance, my nursing staff is aware and working on it.  Meds ordered this encounter   Medications   SUMAtriptan  (TOSYMRA ) 10 MG/ACT SOLN    Sig: Place 1 spray into the nose every hour as needed (max 3 doses per day).    Dispense:  6 each    Refill:  11   SUMAtriptan  Succinate (ZEMBRACE SYMTOUCH ) 3 MG/0.5ML SOAJ    Sig: Inject 3 mg into the skin once as needed for up to 1 dose. May repeat in 15 minutes. If symptoms persist, repeat in 2 hours. Max 4 injections daily.    Dispense:  4 mL    Refill:  11   predniSONE  (DELTASONE ) 20 MG tablet    Sig: Take 100 mg (5 tablets) for 2 days, then take 80 mg (4 tablets) for 2 days, then take 60 mg (3 tablets) for 2 days, and take 40 mg (2 tablets) for 2 days then take 20 mg (1 tablet) for 2 days, then take 10 mg (half tablet) for 2 days then stop.  Take pills in the morning with food.    Dispense:  31 tablet    Refill:  2    DO not fill, please just keep on file for when patient has a Cluster Headache Attack and needs refill   felbamate  (FELBATOL ) 600 MG tablet    Sig: Take 3 tablets (1,800 mg total) by mouth 3 (three) times daily.    Dispense:  810 tablet    Refill:  3   Patient complains of symptoms per HPI as well as the following symptoms: seizure aura . Pertinent  negatives and positives per HPI. All others negative   July 31, 2020: This is a hilarious very nice 63 year old patient who has a past medical history of partial epilepsy on felbamate .  Patient is tried multiple epilepsy medications in the past, felbamate  is the one that has worked the best and had the least side effects.  Unfortunately it is an expensive medication, today I looked into getting her some patient assistance or possibly getting samples and we will continue to work on that.  Patient has a history of cluster headaches, my notes indicate that in the past topiramate had worked for her but today she denied that so I am not sure if topiramate worked in the past for her cluster headaches or not.  Likely she has been in remission, it usually about this time that they  happen but she has not had a cluster.  I discussed if she does to give us  a call and we will start high-dose steroids and probably Emgality.  Other than falling and fracturing her left distal radius, she feels she is doing well, no seizures.  08/02/2019: Stable seizures. She gets up at 4am every morning and works from 6am-3pm but she feels great, she gets 15k steps a day. She had 2 seizures, she knows it is coming, she lays down and they may go away. This year she had it twice, she had 2 seizures. She is under a lot of stress and these happened then she went to sleep and felt better may be stress and not so much a seizure no generalized tonic clonic movements.  Patient does not drive.  We discussed her job, based on her job description I did not see any indication to modify based on her seizures, although I did warn her to be diligent about not swimming alone, not bathing alone, not driving, basically not doing anything that might harm herself or others should she had another seizure-like event. She has not had cluster headaches,   HPI:  Susan Hansen is a 63 y.o. female here as requested by Katina Pfeiffer, PA-C for partial seizures with generalization and cluster headaches on felbamate  and topamax. PMHx HTm as well. Per neurology notes, she was on felbamate  600mg  3.5 tabs 4x daily and topamax 25mg  at last appt with neuro 06-26-2017. Dxed at age 63. She does not drive. Last generalized seizure over a year ago. Typically one every 12-15 months. She was seeing Dr. Marylouise for many years and now he is retiring. Her father passed away in 06-02-2020and her best friend 09/2010. First seizure at 28, she has cluster headaches. She is not on the topamax bc the clusters are in remission. Last generalized seizure a year ago. She feels shaky sometimes and thinks it is glucose and eating protein(eggs) in the morning helps.. She has a seizure aura, she starts feeling something in her stomach, she can go to a room at that time, she  starts shaking and she zones out and pictures [place] and drains her she feels extremely tired, everything goes into slow motion and then sometimes go away and then generalizes.  She is under stress. She started on neurontin, depakote, dilantin, tegretol,  valium and failed multiple meds until the Felbamate  which works for her. Last aura once in the last 8-9 months and did not progress. Last GTC 1.5 years ago. Unclear why she had a breakthrough, she doesn't know how long they last or what happens, when it resolves she feels emotional and guilty.  She does not drive. Unclear etiology of seizures but when she was 13 months she fell off a table. She has had extensive testing in the past and eegs and imaging.   Reviewed notes, labs and imaging from outside physicians, which showed:  CT head 09/08/2008 showed No acute intracranial abnormalities including mass lesion or mass effect, hydrocephalus, extra-axial fluid collection, midline shift, hemorrhage, or acute infarction, large ischemic events (personally reviewed images)  I reviewed neurology notes from Dr. Marylouise.  The first note I see is in April 2017.  She has epilepsy in addition to headaches.  Patient at that time had complained of headache and went to ER and had a CT scan.  In addition to longstanding partial epilepsy she has difficulty with recurring headaches, they occur at night, unilateral occurring in the right orbital area starts as a small pain rapidly builds up to an intense almost intolerable steady headache.  No nausea or vomiting or photophobia.  The episodes last 30 minutes.  She does have some rhinorrhea on the same side recently seen in the emergency room where CAT scan was normal at that time.  Butalbital did not help.  She is had several episodes.  Diagnosed with cluster headaches.  No significant change in her seizure pattern with an occasional partial simple seizure.   Last time she was seen was in May 2019.  She was seen for  seizure follow-up.  At that time patient was well controlled but seem to think she had rare episodes that might be an aura.  She is taking both Felbatol  and Topamax on a regular basis with no particular problem.  Diagnosed with partial seizure with generalization.  Seizures well controlled.  She has been on this medication for years without progression to a generalized seizure.  Since she had been on the medication for years no blood levels.  She is seen mostly every year.  Review of Systems: Patient complains of symptoms per HPI as well as the following symptoms: radial fracture . Pertinent negatives and positives per HPI. All others negative     Social History   Socioeconomic History   Marital status: Married    Spouse name: Not on file   Number of children: 2   Years of education: Not on file   Highest education level: High school graduate  Occupational History   Not on file  Tobacco Use   Smoking status: Never   Smokeless tobacco: Never  Vaping Use   Vaping status: Never Used  Substance and Sexual Activity   Alcohol use: Not Currently    Comment: none in a few years (as of 08/02/19)   Drug use: Never   Sexual activity: Not on file  Other Topics Concern   Not on file  Social History Narrative   Lives at home with her husband   Right handed   Caffiene none.   +   Social Drivers of Corporate investment banker Strain: Not on file  Food Insecurity: No Food Insecurity (09/05/2023)   Hunger Vital Sign    Worried About Running Out of Food in the Last Year: Never true    Ran Out of Food in the Last Year: Never true  Transportation Needs: No Transportation Needs (09/05/2023)   PRAPARE - Administrator, Civil Service (Medical): No    Lack of Transportation (Non-Medical): No  Physical Activity: Not on file  Stress: Not on file  Social Connections: Moderately Isolated (09/05/2023)   Social Connection and  Isolation Panel    Frequency of Communication with Friends and  Family: Never    Frequency of Social Gatherings with Friends and Family: Never    Attends Religious Services: Never    Database administrator or Organizations: No    Attends Engineer, structural: 1 to 4 times per year    Marital Status: Married  Catering manager Violence: Not At Risk (09/05/2023)   Humiliation, Afraid, Rape, and Kick questionnaire    Fear of Current or Ex-Partner: No    Emotionally Abused: No    Physically Abused: No    Sexually Abused: No    Family History  Problem Relation Age of Onset   Stroke Mother    Heart disease Mother    Diabetes Father    Hypertension Father    Heart disease Father    Breast cancer Paternal Grandmother    Seizures Neg Hx     Past Medical History:  Diagnosis Date   Cluster headache    Hyperlipidemia    Hypertension    Migraines    Seizures (HCC)     Patient Active Problem List   Diagnosis Date Noted   New onset atrial flutter (HCC) 09/05/2023   Partial epilepsy with impairment of consciousness, with intractable epilepsy (HCC) 07/14/2018   Episodic cluster headache 07/14/2018    Past Surgical History:  Procedure Laterality Date   A-FLUTTER ABLATION N/A 09/16/2023   Procedure: A-FLUTTER ABLATION;  Surgeon: Inocencio Soyla Lunger, MD;  Location: [facility] INVASIVE CV LAB;  Service: Cardiovascular;  Laterality: N/A;   BREAST BIOPSY Left 2020   CESAREAN SECTION     LOOP RECORDER INSERTION N/A 09/16/2023   Procedure: LOOP RECORDER INSERTION;  Surgeon: Inocencio Soyla Lunger, MD;  Location: [facility] INVAS[facility] CV [facility];  Service: Cardiovascular;  Laterality: N/A;   TRANSESOPHAGEAL ECHOCARDIOGRAM (CATH LAB) N/A 09/16/2023   Procedure: TRANSESOPHAGEAL ECHOCARDIOGRAM;  Surgeon: Inocencio Soyla Lunger, MD;  Location: [facility] INVASIVE CV LAB;  Service: Cardiovascular;  Laterality: N/A;    Current Outpatient Medications  Medication Sig Dispense Refill   aluminum-magnesium hydroxide 200-200 MG/5ML suspension Take 10 mLs by mouth every 6 (six) hours as  needed for indigestion.     apixaban  (ELIQUIS ) 5 MG TABS tablet Take 1 tablet (5 mg total) by mouth 2 (two) times daily. 60 tablet 0   Ascorbic Acid (VITAMIN C PO) Take 1 tablet by mouth daily.     b complex vitamins tablet Take 1 tablet by mouth daily.     Chlorpheniramine Maleate (ALLERGY RELIEF PO) Take 1 tablet by mouth daily. Allertec from [employer]     Coenzyme Q10 (COQ10) 100 MG CAPS Take 1 tablet by mouth daily.     Flaxseed, Linseed, (FLAX SEED OIL PO) Take 1,200 mg by mouth daily.     LECITHIN PO Take 1 tablet by mouth daily.     lisinopril  (ZESTRIL ) 20 MG tablet Take 20 mg by mouth daily.     LORazepam  (ATIVAN ) 1 MG tablet Please take 1 tablet at onset of aura. May take an additional dose in 5-15 minutes. Max 2mg  a day. 30 tablet 4   MAGNESIUM PO Take by mouth. At least 400 mg daily     MELATONIN GUMMIES PO Take 2 each by mouth at bedtime.     metoprolol  tartrate (LOPRESSOR ) 25 MG tablet Take 1 tablet (25 mg total) by mouth 2 (two) times daily. 60 tablet 0   pantoprazole  (PROTONIX ) 40 MG tablet Take 1 tablet (40 mg total) by mouth daily.  60 tablet 1   Red Yeast Rice Extract 300 MG CAPS Take 1 tablet by mouth daily.     rosuvastatin  (CRESTOR ) 5 MG tablet Take 5 mg by mouth daily.     SUMAtriptan  (TOSYMRA ) 10 MG/ACT SOLN Place 1 spray into the nose every hour as needed (max 3 doses per day). 6 each 11   SUMAtriptan  Succinate (ZEMBRACE SYMTOUCH ) 3 MG/0.5ML SOAJ Inject 3 mg into the skin once as needed for up to 1 dose. May repeat in 15 minutes. If symptoms persist, repeat in 2 hours. Max 4 injections daily. 4 mL 11   felbamate  (FELBATOL ) 600 MG tablet Take 3 tablets (1,800 mg total) by mouth 3 (three) times daily. 810 tablet 3   lisinopril -hydrochlorothiazide  (ZESTORETIC ) 20-12.5 MG tablet Take 1 tablet by mouth daily. Not taking, taking lisinopril  only     predniSONE  (DELTASONE ) 20 MG tablet Take 100 mg (5 tablets) for 2 days, then take 80 mg (4 tablets) for 2 days, then take 60 mg (3  tablets) for 2 days, and take 40 mg (2 tablets) for 2 days then take 20 mg (1 tablet) for 2 days, then take 10 mg (half tablet) for 2 days then stop.  Take pills in the morning with food. 31 tablet 2   No current facility-administered medications for this visit.    Allergies as of 09/29/2023 - Review Complete 09/29/2023  Allergen Reaction Noted   Imodium [loperamide]  09/05/2023    Vitals: BP 135/76 (Cuff Size: Normal)   Pulse 74   Ht 5' 6 (1.676 m)   Wt 172 lb 9.6 oz (78.3 kg)   BMI 27.86 kg/m  Last Weight:  Wt Readings from Last 1 Encounters:  09/29/23 172 lb 9.6 oz (78.3 kg)   Last Height:   Ht Readings from Last 1 Encounters:  09/29/23 5' 6 (1.676 m)     Physical exam: Exam: Gen: NAD, conversant      CV: No palpitations or chest pain or SOB. VS: Breathing at a normal rate. Weight appears within normal limits. Not febrile. Eyes: Conjunctivae clear without exudates or hemorrhage  Neuro: Detailed Neurologic Exam  Speech:    Speech is normal; fluent and spontaneous with normal comprehension.  Cognition:    The patient is oriented to person, place, and time;     recent and remote memory intact;     language fluent;     normal attention, concentration, fund of knowledge Cranial Nerves:    The pupils are equal, round, and reactive to light. Visual fields are full Extraocular movements are intact.  The face is symmetric with normal sensation. The palate elevates in the midline. Hearing intact. Voice is normal. Shoulder shrug is normal. The tongue has normal motion without fasciculations.   Coordination: normal  Gait:    No abnormalities noted or reported  Motor Observation:   no involuntary movements noted. Tone:    Appears normal  Posture:    Posture is normal. normal erect    Strength:    Strength is anti-gravity and symmetric in the upper and lower limbs.      Sensation: intact to LT, no reports of numbness or tingling or paresthesias               Assessment/Plan:  63 y.o. female here as requested by Katina Pfeiffer, PA-C for partial seizures with generalization and cluster headaches on felbamate (tried and failed multiple others). Per neurology notes, she was on felbamate  600mg  3.5 tabs 4x daily(8400mg ) and topamax 25mg   at last appt with neuro 05-2017, confirmed. Dxed at age 60. She does not drive. We changed her to 5400mg  a day and she has been stable and doing well.   Cluster headaches: Steroids on hand Sumatriptan  injection(zembrace) can take up to 4 injections a day, samples Tosymra  (Sumatriptan ) nasal spray, samples Lidocaine  nasal spray: Compound a lidocaine  spray for acute migraine and episodic migraine use: 4% lidocaine  solution to use intranasally as an abortive therapy. Four sprays of lidocaine  ipsilateral to the pain at onset, and two more, if necessary, in 10 minutes. Q6hours as needed.  [facility] [address] - you can call when needed(KEEP ON FILE) Order Oxygen 12-15L/min high flow mask:  Inhaled oxygen at 100%, 12 L/min, delivered by face mask, for 15 minutes at the start of an attack of cluster headache   ON HAND: Start a high-dose steroid taper on start of cluster: please keep on hand explained it has to be high dose over 2 weeks for cluster headaches and the risks and side effects with agreement and understanding form patient. This is the quickest way to treat episodic cluster cycles at onset. Take 100 mg (5 tablets) for 2 days, then take 80 mg (4 tablets) for 2 days, then take 60 mg (3 tablets) for 2 days, and take 40 mg (2 tablets) for 2 days then take 20 mg (1 tablet) for 2 days, then take 10 mg (half tablet) for 2 days then stop.  Take pills in the morning with food.  Episodic Cluster HA: Stable. Used Topiramate and did well (she denies she used Topamax in the past today). Not on it now as the cluster headaches are still in remission. If they occur, start high dose steroids (start at 100mg  daily and  taper over 2 weeks), Topamax or Emgality. She will call. She has not had a cluster headache since last being seen.  Partial seizures with generaliztion: Continue Felbamate . Ativan  or extra felbamate  pill for aura. Per prior neuro notes, she has been prescribed Felbamate  600mg  3.5 tabs 4x a day(#240). This is an excessive dose but considering she has been stable on this dose for years will continue but actually now on 5400mg  (1800mg  tid) and stable with occ aura, does not drive. Discussed risks, she understands this is a high dose and acknowledges risks.    Meds ordered this encounter  Medications   SUMAtriptan  (TOSYMRA ) 10 MG/ACT SOLN    Sig: Place 1 spray into the nose every hour as needed (max 3 doses per day).    Dispense:  6 each    Refill:  11   SUMAtriptan  Succinate (ZEMBRACE SYMTOUCH ) 3 MG/0.5ML SOAJ    Sig: Inject 3 mg into the skin once as needed for up to 1 dose. May repeat in 15 minutes. If symptoms persist, repeat in 2 hours. Max 4 injections daily.    Dispense:  4 mL    Refill:  11   predniSONE  (DELTASONE ) 20 MG tablet    Sig: Take 100 mg (5 tablets) for 2 days, then take 80 mg (4 tablets) for 2 days, then take 60 mg (3 tablets) for 2 days, and take 40 mg (2 tablets) for 2 days then take 20 mg (1 tablet) for 2 days, then take 10 mg (half tablet) for 2 days then stop.  Take pills in the morning with food.    Dispense:  31 tablet    Refill:  2    DO not fill, please just keep on file  for when patient has a Cluster Headache Attack and needs refill   felbamate  (FELBATOL ) 600 MG tablet    Sig: Take 3 tablets (1,800 mg total) by mouth 3 (three) times daily.    Dispense:  810 tablet    Refill:  3   Orders Placed This Encounter  Procedures   For home use only DME oxygen      Cc: Katina Pfeiffer, PA-C,    Onetha Epp, MD  [facility] [address] [place], [place] [address]  Phone [phone] Fax [phone]  I spent 52 minutes of  face-to-face and non-face-to-face time with patient on the  1. Partial epilepsy with impairment of consciousness, with intractable epilepsy (HCC)   2. Episodic cluster headache, not intractable     diagnosis.  This included previsit chart review, lab review, study review, order entry, electronic health record documentation, patient education on the different diagnostic and therapeutic options, counseling and coordination of care, risks and benefits of management, compliance, or risk factor reduction

## 2023-09-30 NOTE — Telephone Encounter (Signed)
 Completed FMLA form faxed to [employer] and scanned into chart. Billing notified.

## 2023-09-30 NOTE — Telephone Encounter (Signed)
 Alan thank you, appreciate your help on this.

## 2023-10-03 ENCOUNTER — Telehealth: Payer: Self-pay | Admitting: *Deleted

## 2023-10-03 NOTE — Telephone Encounter (Signed)
 Sent order to [employer] this am

## 2023-10-03 NOTE — Telephone Encounter (Signed)
-----   Message from Onetha KATHEE Epp sent at 10/02/2023  2:12 PM EDT ----- Regarding: Order oxygen Can we place an order for O2 for her episodic ckustr headaches please? Is it just a DME order?   Order Oxygen 12-15L/min high flow mask:  Inhaled oxygen at 100%, 12 L/min, delivered by face mask, for 15 minutes at the start of an attack of cluster headache

## 2023-10-04 ENCOUNTER — Encounter: Payer: Self-pay | Admitting: Cardiology

## 2023-10-06 MED ORDER — APIXABAN 5 MG PO TABS: 5.0000 mg | ORAL_TABLET | Freq: Two times a day (BID) | ORAL | 0 refills | Status: DC

## 2023-10-07 ENCOUNTER — Telehealth: Payer: Self-pay | Admitting: Cardiology

## 2023-10-07 NOTE — Telephone Encounter (Signed)
 FMLA payment posted to pt's account.  JB, 10-07-23

## 2023-10-10 NOTE — Telephone Encounter (Addendum)
 Spent almost 20 minutes on  the phone with patient.  She would like to clarify that she is now light headed, not dizzy like she was.   BP:    10th 123/76, HR 60  13th 117/71, HR 102   137/84, HR 80  14th 124/77, HR 99   145/87, HR 93  15th 131/82, HR 98  She is still light headed, and has been holding the Metoprolol  as instructed.   Advised to hold Lisinopril  and let us  know by the end of the week if no improvement.  Aware that if this does not show improvement then her next steps may be to be evaluated by PCP/neurology.  Aware we will address that if needed later this week. Patient verbalized understanding and agreeable to plan.   She also reports that she did not take her Eliquis  this morning as she thought this might be the culprit.  Patient denies any bleeding.  Educated to importance of not missing this medication going forward, especially post ablation. Aware to not miss another dose going forward without discussing with our office.

## 2023-10-13 DIAGNOSIS — R42 Dizziness and giddiness: Secondary | ICD-10-CM | POA: Diagnosis not present

## 2023-10-13 DIAGNOSIS — G629 Polyneuropathy, unspecified: Secondary | ICD-10-CM | POA: Diagnosis not present

## 2023-10-14 ENCOUNTER — Encounter: Payer: Self-pay | Admitting: Cardiology

## 2023-10-14 ENCOUNTER — Telehealth: Payer: Self-pay | Admitting: Cardiology

## 2023-10-14 NOTE — Telephone Encounter (Signed)
 Called and left message for [employer] to call me back to discuss what they need specifically that is not included.  The new paperwork send has dates of leave  - 7/31 - 8/10 which is prior to pt ever being seen by Dr. Inocencio.  Will await call back to discuss.

## 2023-10-14 NOTE — Telephone Encounter (Signed)
 Additional FMLA forms received [employer]. Forms left in Dr. Manford mailbox.  JB, 10-14-23

## 2023-10-17 ENCOUNTER — Ambulatory Visit (INDEPENDENT_AMBULATORY_CARE_PROVIDER_SITE_OTHER)

## 2023-10-17 DIAGNOSIS — I4892 Unspecified atrial flutter: Secondary | ICD-10-CM | POA: Diagnosis not present

## 2023-10-17 NOTE — Telephone Encounter (Signed)
 Spoke to [employer].  They have approved the leave and need nothing further from our office at this time.

## 2023-10-18 LAB — CUP PACEART REMOTE DEVICE CHECK
Date Time Interrogation Session: 20250922163641
Implantable Pulse Generator Implant Date: 20250822

## 2023-10-18 NOTE — Progress Notes (Signed)
 Remote Loop Recorder Transmission

## 2023-10-19 ENCOUNTER — Encounter: Payer: Self-pay | Admitting: Physician Assistant

## 2023-10-19 ENCOUNTER — Ambulatory Visit: Attending: Cardiology | Admitting: Physician Assistant

## 2023-10-19 VITALS — BP 134/68 | HR 101 | Ht 66.0 in | Wt 166.2 lb

## 2023-10-19 DIAGNOSIS — I483 Typical atrial flutter: Secondary | ICD-10-CM | POA: Diagnosis not present

## 2023-10-19 DIAGNOSIS — I10 Essential (primary) hypertension: Secondary | ICD-10-CM | POA: Diagnosis not present

## 2023-10-19 DIAGNOSIS — Z95818 Presence of other cardiac implants and grafts: Secondary | ICD-10-CM | POA: Diagnosis not present

## 2023-10-19 NOTE — Progress Notes (Signed)
 Cardiology Office Note:  .   Date:  10/19/2023  ID:  Susan Hansen, DOB Nov 07, 1960, MRN [id number] PCP: Susan Pfeiffer, PA-C  [facility] Providers Cardiologist:  Susan ONEIDA Decent, MD Electrophysiologist:  Susan Gladis Norton, MD {  History of Present Illness: Susan Hansen is a 63 y.o. female w/PMHx of  HTN, HLD, seizure d/o AFlutter  Referred to Dr. Norton 09/13/23 for new findings of AFib/AFlutter.  AT this visit she was in a typical AFlutter. Seizure meds likely to be difficult to find AAD without interactions, planned for TEE/ablation of her flutter  09/16/23: AFlutter ablation / loop implant  A number of chart conversations with dizziness, lightheaded via our nirses as well as neuro team Was on steroid > neuro advised holding We advised hold metop then lisinopril  (despite normal BP and HRs reported) to see if helped >> evaluate further with PMD, neuro  She mentioned she stopped eliquis  as well  Given FMLA   Today's visit is scheduled as her 30 day post ablation visit ROS:   She feels well, much better particularly off the metoprolol . She has b/l aching pain and wonders about her Crestor . Recalls ~1 year ago, similar her PMD had her reduce her dosing to every other day, realized that in the last several months inadvertently resumed daily and since then legs have steadily become more achy. She will resume every other day dosing and follow up with her PMD  No CP, palpitations, or cardiac awareness No SOB No near syncope or syncope No bleeding or signs of bleeding   Device information MDT ILR implanted 09/16/23, afib surveillance  Arrhythmia/AAD hx AFlutter found Aug 2025 (Afib is mentioned in chart though her EKGs personally reviewed are all AFlutter)  Studies Reviewed: Susan Hansen    EKG done today and reviewed by myself:  SR 93bpm  Device interrogation: carelink personally reviewed today: No AFib/arrhythmias 0.8% PVC burden SR presenting   09/16/23:  EPS/ablation CONCLUSIONS:   1. Isthmus-dependent right atrial flutter upon presentation.   2. Successful radiofrequency ablation of atrial flutter along the cavotricuspid isthmus with complete bidirectional isthmus block achieved.   3. No inducible arrhythmias following ablation.   4. No early apparent complications.   09/16/23: TEE No LAA thrombus Preserved LV function despite tachycardia Mildly reduced RV function No significant valvular pathology Full Report to follow.  ETT 09/29/2018 There was no ST segment deviation noted during stress.   This is a negative exercise ECG treadmill test.  There were no EKG changes concerning for ischemia. 7 min 01 sec.  The patient achieved 8.5 METS, indicating average functional capacity.  The study was stopped due to shortness of breath/fatigue.  Overall, the low-risk study.   Risk Assessment/Calculations:    Physical Exam:   VS:  There were no vitals taken for this visit.   Wt Readings from Last 3 Encounters:  09/29/23 172 lb 9.6 oz (78.3 kg)  09/16/23 172 lb (78 kg)  09/13/23 172 lb (78 kg)    GEN: Well nourished, well developed in no acute distress NECK: No JVD; No carotid bruits CARDIAC: RRR, no murmurs, rubs, gallops RESPIRATORY:  CTA b/l without rales, wheezing or rhonchi  ABDOMEN: Soft, non-tender, non-distended EXTREMITIES: No edema; No deformity   ILR site: is stable, well healed, no thinning, fluctuation, tethering  ASSESSMENT AND PLAN: .    AFlutter CHA2DS2Vasc is 3 (including gender), on Eliquis , appropriately dosed S/p CTI ablation No arrhythmias post flutter ablation Will stop Eliquis  today  HTN Looks ok      Dispo: Ill have her back in 2 mo, follow up.  Signed, Susan Macario Arthur, PA-C

## 2023-10-19 NOTE — Patient Instructions (Addendum)
 Medication Instructions:   STOP TAKING AND REMOVE THIS MEDICATION FROM YOUR MEDICATION LIST:    ELIQUIS   METOPROLOL  LISINOPRIL     *If you need a refill on your cardiac medications before your next appointment, please call your pharmacy*    Lab Work: NONE ORDERED  TODAY    If you have labs (blood work) drawn today and your tests are completely normal, you will receive your results only by: MyChart Message (if you have MyChart) OR A paper copy in the mail If you have any lab test that is abnormal or we need to change your treatment, we will call you to review the results.    Testing/Procedures: NONE ORDERED  TODAY     Follow-Up: At [facility], you and your health needs are our priority.  As part of our continuing mission to provide you with exceptional heart care, our providers are all part of one team.  This team includes your primary Cardiologist (physician) and Advanced Practice Providers or APPs (Physician Assistants and Nurse Practitioners) who all work together to provide you with the care you need, when you need it.  Your next appointment:   2 month(s)   Provider:  Charlies Arthur, PA-C    We recommend signing up for the patient portal called MyChart.  Sign up information is provided on this After Visit Summary.  MyChart is used to connect with patients for Virtual Visits (Telemedicine).  Patients are able to view lab/test results, encounter notes, upcoming appointments, etc.  Non-urgent messages can be sent to your provider as well.   To learn more about what you can do with MyChart, go to [email or web address].    Other Instructions

## 2023-10-20 DIAGNOSIS — H5203 Hypermetropia, bilateral: Secondary | ICD-10-CM | POA: Diagnosis not present

## 2023-10-21 ENCOUNTER — Encounter

## 2023-10-24 NOTE — Telephone Encounter (Signed)
 Alan..... any news/updates on this?

## 2023-11-10 DIAGNOSIS — G629 Polyneuropathy, unspecified: Secondary | ICD-10-CM | POA: Diagnosis not present

## 2023-11-10 DIAGNOSIS — Z0000 Encounter for general adult medical examination without abnormal findings: Secondary | ICD-10-CM | POA: Diagnosis not present

## 2023-11-10 DIAGNOSIS — I10 Essential (primary) hypertension: Secondary | ICD-10-CM | POA: Diagnosis not present

## 2023-11-10 DIAGNOSIS — Z1331 Encounter for screening for depression: Secondary | ICD-10-CM | POA: Diagnosis not present

## 2023-11-10 DIAGNOSIS — R29898 Other symptoms and signs involving the musculoskeletal system: Secondary | ICD-10-CM | POA: Diagnosis not present

## 2023-11-10 DIAGNOSIS — R42 Dizziness and giddiness: Secondary | ICD-10-CM | POA: Diagnosis not present

## 2023-11-10 DIAGNOSIS — Z9889 Other specified postprocedural states: Secondary | ICD-10-CM | POA: Diagnosis not present

## 2023-11-10 DIAGNOSIS — I483 Typical atrial flutter: Secondary | ICD-10-CM | POA: Diagnosis not present

## 2023-11-10 DIAGNOSIS — E7800 Pure hypercholesterolemia, unspecified: Secondary | ICD-10-CM | POA: Diagnosis not present

## 2023-11-10 DIAGNOSIS — Z6826 Body mass index (BMI) 26.0-26.9, adult: Secondary | ICD-10-CM | POA: Diagnosis not present

## 2023-11-10 DIAGNOSIS — G40909 Epilepsy, unspecified, not intractable, without status epilepticus: Secondary | ICD-10-CM | POA: Diagnosis not present

## 2023-11-10 DIAGNOSIS — M791 Myalgia, unspecified site: Secondary | ICD-10-CM | POA: Diagnosis not present

## 2023-11-14 DIAGNOSIS — Z1231 Encounter for screening mammogram for malignant neoplasm of breast: Secondary | ICD-10-CM | POA: Diagnosis not present

## 2023-11-17 ENCOUNTER — Ambulatory Visit

## 2023-11-17 ENCOUNTER — Encounter

## 2023-11-17 DIAGNOSIS — I483 Typical atrial flutter: Secondary | ICD-10-CM | POA: Diagnosis not present

## 2023-11-18 LAB — CUP PACEART REMOTE DEVICE CHECK
Date Time Interrogation Session: 20251023163631
Implantable Pulse Generator Implant Date: 20250822

## 2023-11-21 ENCOUNTER — Encounter

## 2023-11-21 NOTE — Progress Notes (Signed)
 Remote Loop Recorder Transmission

## 2023-12-01 ENCOUNTER — Encounter: Payer: Self-pay | Admitting: Cardiology

## 2023-12-01 ENCOUNTER — Ambulatory Visit: Attending: Physician Assistant | Admitting: Cardiology

## 2023-12-01 VITALS — BP 162/74 | HR 74 | Ht 66.0 in | Wt 168.0 lb

## 2023-12-01 DIAGNOSIS — I10 Essential (primary) hypertension: Secondary | ICD-10-CM | POA: Diagnosis not present

## 2023-12-01 DIAGNOSIS — Z95818 Presence of other cardiac implants and grafts: Secondary | ICD-10-CM | POA: Diagnosis not present

## 2023-12-01 DIAGNOSIS — D6869 Other thrombophilia: Secondary | ICD-10-CM | POA: Diagnosis not present

## 2023-12-01 DIAGNOSIS — I483 Typical atrial flutter: Secondary | ICD-10-CM

## 2023-12-01 NOTE — Progress Notes (Signed)
   Electrophysiology Office Note:   Date:  12/01/2023  ID:  Susan, Hansen 07/08/1960, MRN [id number]  Primary Cardiologist: Darryle ONEIDA Decent, MD Electrophysiologist: Soyla Gladis Norton, MD      History of Present Illness:   Susan Hansen is a 63 y.o. female with h/o atrial flutter (ILR in place), hypertension, hyperlipidemia, seizure seen today for routine electrophysiology followup.   Patient referred to EP in August for new afib/flutter, noted to be in typical atrial flutter. Given chronic seizure medication regimen, no AAD started by Dr. Norton with high interaction potential. Plans made for ablation instead. Successful isthmus dependent counter clockwise right atrial flutter ablation on 09/16/22. ILR implanted at time of ablation as well.   At one month follow up visit with Renee on 9/24, patient overall feeling much better. Had felt poorly on Metoprolol  (stopped prior to visit) but notably better since stopping. No arrhythmia post ablation on ILR and Eliquis  stopped.   Subsequent remote reports from ILR without arrhythmia.  Since last being seen in our clinic the patient reports doing well.  she denies chest pain, palpitations, dyspnea, PND, orthopnea, nausea, vomiting, dizziness, syncope, edema, weight gain, or early satiety. She remains off lisinopril  and metoprolol , expresses some concern about elevated BP.  Review of systems complete and found to be negative unless listed in HPI.   Studies Reviewed:    EKG is not ordered today. EKG from 10/19/23 reviewed which showed NSR, with artifact. HR 94bpm.          Arrhythmia/Device History ILR MDT WC      Physical Exam:   VS:  BP (!) 162/74   Pulse 74   Ht 5' 6 (1.676 m)   Wt 168 lb (76.2 kg)   BMI 27.12 kg/m    Wt Readings from Last 3 Encounters:  12/01/23 168 lb (76.2 kg)  10/19/23 166 lb 3.2 oz (75.4 kg)  09/29/23 172 lb 9.6 oz (78.3 kg)     GEN: No acute distress NECK: No JVD CARDIAC: Regular rate and rhythm, no  murmurs, rubs, gallops RESPIRATORY:  Clear to auscultation without rales, wheezing or rhonchi  ABDOMEN: Soft, non-tender, non-distended EXTREMITIES:  No edema; No deformity   ILR Interrogation- reviewed in detail today,  See PACEART report  ASSESSMENT AND PLAN:    Atrial flutter s/p [employer] Loop recorder S/P ablation 09/16/23. Normal device function See Elisabeth Art report. No arrhythmia. No changes today.  Hypertension Patient's BP elevated today, 169/82 and 162/74 on recheck. Previously on Lisinopril  but stopped recently with dizziness. Patient not currently checking her BP at home but does wonder if she needs to restart medication. Patient asked to check home BP for a week and report measurements to her PCP. Suspect that she will need to restart daily Lisinopril . Will defer management to PCP     Follow up with EP Team in 12 months  Signed, Artist Pouch, PA-C

## 2023-12-01 NOTE — Patient Instructions (Signed)
 Medication Instructions:   Your physician recommends that you continue on your current medications as directed. Please refer to the Current Medication list given to you today.  *If you need a refill on your cardiac medications before your next appointment, please call your pharmacy*   Lab Work: NONE ORDERED  TODAY    If you have labs (blood work) drawn today and your tests are completely normal, you will receive your results only by: MyChart Message (if you have MyChart) OR A paper copy in the mail If you have any lab test that is abnormal or we need to change your treatment, we will call you to review the results.   Testing/Procedures: NONE ORDERED  TODAY     Follow-Up: At [facility], you and your health needs are our priority.  As part of our continuing mission to provide you with exceptional heart care, our providers are all part of one team.  This team includes your primary Cardiologist (physician) and Advanced Practice Providers or APPs (Physician Assistants and Nurse Practitioners) who all work together to provide you with the care you need, when you need it.  Your next appointment:    1 year(s)  Provider:    You may see Will Gladis Norton, MD or one of the following Advanced Practice Providers on your designated Care Team:   Charlies Arthur, PA-C Michael Andy Tillery, PA-C Brandi Ollis, NP Artist Pouch, PA-C    We recommend signing up for the patient portal called MyChart.  Sign up information is provided on this After Visit Summary.  MyChart is used to connect with patients for Virtual Visits (Telemedicine).  Patients are able to view lab/test results, encounter notes, upcoming appointments, etc.  Non-urgent messages can be sent to your provider as well.   To learn more about what you can do with MyChart, go to [email or web address].   Other Instructions

## 2023-12-18 ENCOUNTER — Ambulatory Visit

## 2023-12-19 ENCOUNTER — Encounter

## 2023-12-20 ENCOUNTER — Ambulatory Visit

## 2023-12-20 DIAGNOSIS — I483 Typical atrial flutter: Secondary | ICD-10-CM

## 2023-12-20 LAB — CUP PACEART REMOTE DEVICE CHECK
Date Time Interrogation Session: 20251124233910
Implantable Pulse Generator Implant Date: 20250822

## 2023-12-21 NOTE — Progress Notes (Signed)
 Remote Loop Recorder Transmission

## 2023-12-22 ENCOUNTER — Encounter

## 2024-01-19 ENCOUNTER — Encounter

## 2024-01-20 ENCOUNTER — Ambulatory Visit

## 2024-01-20 DIAGNOSIS — I483 Typical atrial flutter: Secondary | ICD-10-CM

## 2024-01-20 LAB — CUP PACEART REMOTE DEVICE CHECK
Date Time Interrogation Session: 20251225234435
Implantable Pulse Generator Implant Date: 20250822

## 2024-01-23 ENCOUNTER — Encounter

## 2024-01-25 NOTE — Progress Notes (Signed)
 Remote Loop Recorder Transmission

## 2024-02-20 ENCOUNTER — Ambulatory Visit

## 2024-02-20 ENCOUNTER — Encounter

## 2024-02-20 DIAGNOSIS — I483 Typical atrial flutter: Secondary | ICD-10-CM

## 2024-02-20 LAB — CUP PACEART REMOTE DEVICE CHECK
Date Time Interrogation Session: 20260125235117
Implantable Pulse Generator Implant Date: 20250822

## 2024-02-23 ENCOUNTER — Encounter

## 2024-02-27 NOTE — Progress Notes (Signed)
 Remote Loop Recorder Transmission

## 2024-03-22 ENCOUNTER — Ambulatory Visit

## 2024-04-22 ENCOUNTER — Ambulatory Visit

## 2024-05-23 ENCOUNTER — Ambulatory Visit

## 2024-06-23 ENCOUNTER — Ambulatory Visit

## 2024-07-24 ENCOUNTER — Ambulatory Visit

## 2024-08-24 ENCOUNTER — Ambulatory Visit

## 2024-09-24 ENCOUNTER — Ambulatory Visit

## 2024-10-03 ENCOUNTER — Ambulatory Visit: Admitting: Neurology

## 2024-10-25 ENCOUNTER — Ambulatory Visit

## 2024-11-25 ENCOUNTER — Ambulatory Visit

## 2024-12-26 ENCOUNTER — Ambulatory Visit

## 2025-01-26 ENCOUNTER — Ambulatory Visit
